# Patient Record
Sex: Female | Born: 1945 | Race: White | Hispanic: No | Smoking: Current every day smoker
Health system: Southern US, Community
[De-identification: ages and names within clinical notes are randomized; demographics above are authoritative.]

## PROBLEM LIST (undated history)

## (undated) DIAGNOSIS — F329 Major depressive disorder, single episode, unspecified: Secondary | ICD-10-CM

## (undated) DIAGNOSIS — Z860100 Personal history of colon polyps, unspecified: Secondary | ICD-10-CM

## (undated) DIAGNOSIS — K219 Gastro-esophageal reflux disease without esophagitis: Secondary | ICD-10-CM

## (undated) DIAGNOSIS — F32A Depression, unspecified: Secondary | ICD-10-CM

## (undated) DIAGNOSIS — E042 Nontoxic multinodular goiter: Secondary | ICD-10-CM

## (undated) DIAGNOSIS — C519 Malignant neoplasm of vulva, unspecified: Secondary | ICD-10-CM

## (undated) DIAGNOSIS — A630 Anogenital (venereal) warts: Secondary | ICD-10-CM

## (undated) DIAGNOSIS — M1990 Unspecified osteoarthritis, unspecified site: Secondary | ICD-10-CM

## (undated) DIAGNOSIS — I10 Essential (primary) hypertension: Secondary | ICD-10-CM

## (undated) DIAGNOSIS — R0602 Shortness of breath: Secondary | ICD-10-CM

## (undated) DIAGNOSIS — E785 Hyperlipidemia, unspecified: Secondary | ICD-10-CM

## (undated) DIAGNOSIS — J449 Chronic obstructive pulmonary disease, unspecified: Secondary | ICD-10-CM

## (undated) DIAGNOSIS — I639 Cerebral infarction, unspecified: Secondary | ICD-10-CM

## (undated) DIAGNOSIS — I6529 Occlusion and stenosis of unspecified carotid artery: Secondary | ICD-10-CM

## (undated) DIAGNOSIS — E0590 Thyrotoxicosis, unspecified without thyrotoxic crisis or storm: Secondary | ICD-10-CM

## (undated) DIAGNOSIS — K559 Vascular disorder of intestine, unspecified: Secondary | ICD-10-CM

## (undated) DIAGNOSIS — J439 Emphysema, unspecified: Secondary | ICD-10-CM

## (undated) DIAGNOSIS — I519 Heart disease, unspecified: Secondary | ICD-10-CM

## (undated) HISTORY — DX: Depression, unspecified: F32.A

## (undated) HISTORY — PX: TUBAL LIGATION: SHX77

## (undated) HISTORY — DX: Emphysema, unspecified: J43.9

## (undated) HISTORY — PX: APPENDECTOMY: SHX54

## (undated) HISTORY — PX: NOSE SURGERY: SHX723

## (undated) HISTORY — DX: Major depressive disorder, single episode, unspecified: F32.9

## (undated) HISTORY — DX: Nontoxic multinodular goiter: E04.2

## (undated) HISTORY — DX: Heart disease, unspecified: I51.9

## (undated) HISTORY — PX: CARPAL TUNNEL RELEASE: SHX101

## (undated) HISTORY — DX: Anogenital (venereal) warts: A63.0

## (undated) HISTORY — DX: Malignant neoplasm of vulva, unspecified: C51.9

## (undated) HISTORY — DX: Personal history of colon polyps, unspecified: Z86.0100

## (undated) HISTORY — DX: Unspecified osteoarthritis, unspecified site: M19.90

## (undated) HISTORY — DX: Personal history of colonic polyps: Z86.010

## (undated) HISTORY — DX: Occlusion and stenosis of unspecified carotid artery: I65.29

## (undated) HISTORY — PX: CHOLECYSTECTOMY: SHX55

## (undated) HISTORY — DX: Hyperlipidemia, unspecified: E78.5

## (undated) HISTORY — DX: Vascular disorder of intestine, unspecified: K55.9

---
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
## 1973-08-05 DIAGNOSIS — C519 Malignant neoplasm of vulva, unspecified: Secondary | ICD-10-CM

## 1973-08-05 HISTORY — DX: Malignant neoplasm of vulva, unspecified: C51.9

## 1998-03-02 ENCOUNTER — Ambulatory Visit (HOSPITAL_COMMUNITY): Admission: RE | Admit: 1998-03-02 | Discharge: 1998-03-02 | Payer: Self-pay | Admitting: Family Medicine

## 2001-11-03 ENCOUNTER — Emergency Department (HOSPITAL_COMMUNITY): Admission: EM | Admit: 2001-11-03 | Discharge: 2001-11-03 | Payer: Self-pay | Admitting: Emergency Medicine

## 2008-08-05 DIAGNOSIS — I639 Cerebral infarction, unspecified: Secondary | ICD-10-CM

## 2008-08-05 HISTORY — DX: Cerebral infarction, unspecified: I63.9

## 2011-10-09 ENCOUNTER — Encounter (HOSPITAL_COMMUNITY): Payer: Self-pay

## 2011-10-09 ENCOUNTER — Emergency Department (HOSPITAL_COMMUNITY)
Admission: EM | Admit: 2011-10-09 | Discharge: 2011-10-09 | Disposition: A | Discharge status: Home Health | Payer: Medicare Other | Attending: Emergency Medicine | Admitting: Emergency Medicine

## 2011-10-09 ENCOUNTER — Emergency Department (HOSPITAL_COMMUNITY): Payer: Medicare Other

## 2011-10-09 DIAGNOSIS — J4489 Other specified chronic obstructive pulmonary disease: Secondary | ICD-10-CM | POA: Insufficient documentation

## 2011-10-09 DIAGNOSIS — Z8679 Personal history of other diseases of the circulatory system: Secondary | ICD-10-CM | POA: Insufficient documentation

## 2011-10-09 DIAGNOSIS — I10 Essential (primary) hypertension: Secondary | ICD-10-CM | POA: Insufficient documentation

## 2011-10-09 DIAGNOSIS — R109 Unspecified abdominal pain: Secondary | ICD-10-CM | POA: Insufficient documentation

## 2011-10-09 DIAGNOSIS — R350 Frequency of micturition: Secondary | ICD-10-CM | POA: Insufficient documentation

## 2011-10-09 DIAGNOSIS — J449 Chronic obstructive pulmonary disease, unspecified: Secondary | ICD-10-CM | POA: Insufficient documentation

## 2011-10-09 HISTORY — DX: Essential (primary) hypertension: I10

## 2011-10-09 HISTORY — DX: Cerebral infarction, unspecified: I63.9

## 2011-10-09 HISTORY — DX: Chronic obstructive pulmonary disease, unspecified: J44.9

## 2011-10-09 LAB — URINALYSIS, ROUTINE W REFLEX MICROSCOPIC
Protein, ur: NEGATIVE mg/dL
Specific Gravity, Urine: 1.008 (ref 1.005–1.030)
Urobilinogen, UA: 0.2 mg/dL (ref 0.0–1.0)

## 2011-10-09 LAB — URINE MICROSCOPIC-ADD ON

## 2011-10-09 MED ORDER — OXYCODONE-ACETAMINOPHEN 5-325 MG PO TABS
1.0000 | ORAL_TABLET | Freq: Once | ORAL | Status: AC
  Administered 2011-10-09: 1 via ORAL
  Filled 2011-10-09: qty 1

## 2011-10-09 MED ORDER — OXYCODONE-ACETAMINOPHEN 5-325 MG PO TABS: 1.0000 | ORAL_TABLET | Freq: Once | ORAL | Status: AC

## 2011-10-09 MED ORDER — ONDANSETRON 4 MG PO TBDP
8.0000 mg | ORAL_TABLET | Freq: Once | ORAL | Status: AC
  Administered 2011-10-09: 8 mg via ORAL
  Filled 2011-10-09: qty 2

## 2011-10-09 NOTE — ED Provider Notes (Signed)
Medical screening examination/treatment/procedure(s) were conducted as a shared visit with non-physician practitioner(s) and myself.  I personally evaluated the patient during the encounter   Joya Gaskins, MD 10/09/11 [phone]

## 2011-10-09 NOTE — ED Notes (Signed)
Returned from ct 

## 2011-10-09 NOTE — Discharge Instructions (Signed)

## 2011-10-09 NOTE — ED Provider Notes (Signed)
History     CSN: [id number]  Arrival date & time 10/09/11  1328   First MD Initiated Contact with Patient 10/09/11 1417      Chief Complaint  Patient presents with  . Flank Pain    Patient is a 66 y.o. female presenting with flank pain. The history is provided by the patient.  Flank Pain This is a new problem. The current episode started more than 2 days ago. The problem occurs constantly. The problem has been gradually worsening. Pertinent negatives include no chest pain and no abdominal pain. The symptoms are aggravated by nothing. The symptoms are relieved by nothing.  she has not tried anything for the symptoms she also reports dark colored urine and urinary frequency She denies known h/o kidney stones No falls reported No weakness/numbness in her legs   Past Medical History  Diagnosis Date  . COPD (chronic obstructive pulmonary disease)   . Stroke   . Hypertension   . Cancer     Past Surgical History  Procedure Date  . Cholecystectomy   . Abdominal hysterectomy   . Tubal ligation   . Appendectomy   . Carpal tunnel release   . Nose surgery     No family history on file.  History  Substance Use Topics  . Smoking status: Current Everyday Smoker -- 1.0 packs/day  . Smokeless tobacco: Not on file  . Alcohol Use: No    OB History    Grav Para Term Preterm Abortions TAB SAB Ect Mult Living                  Review of Systems  Cardiovascular: Negative for chest pain.  Gastrointestinal: Negative for abdominal pain.  Genitourinary: Positive for flank pain.  All other systems reviewed and are negative.    Allergies  Review of patient's allergies indicates no known allergies.  Home Medications   Current Outpatient Rx  Name Route Sig Dispense Refill  . ALBUTEROL SULFATE (2.5 MG/3ML) 0.083% IN NEBU Nebulization Take 2.5 mg by nebulization every 6 (six) hours as needed. For shortness of breath    . AMLODIPINE BESYLATE 10 MG PO TABS Oral Take 10 mg by mouth  daily.    Marland Kitchen CLOPIDOGREL BISULFATE 75 MG PO TABS Oral Take 75 mg by mouth daily.    Marland Kitchen FLUOXETINE HCL 40 MG PO CAPS Oral Take 40 mg by mouth daily.    . IBUPROFEN 200 MG PO TABS Oral Take 400 mg by mouth every 6 (six) hours as needed. For pain    . LISINOPRIL 20 MG PO TABS Oral Take 20 mg by mouth daily.    . MOMETASONE FURO-FORMOTEROL FUM 100-5 MCG/ACT IN AERO Inhalation Inhale 2 puffs into the lungs 2 (two) times daily.    Marland Kitchen PANTOPRAZOLE SODIUM 40 MG PO TBEC Oral Take 40 mg by mouth daily.    Marland Kitchen PRAMIPEXOLE DIHYDROCHLORIDE 0.25 MG PO TABS Oral Take 0.25 mg by mouth at bedtime.    Marland Kitchen TIOTROPIUM BROMIDE MONOHYDRATE 18 MCG IN CAPS Inhalation Place 18 mcg into inhaler and inhale daily.    . TRAMADOL HCL 50 MG PO TABS Oral Take 50 mg by mouth every 6 (six) hours as needed. For pain    . TRAZODONE HCL 50 MG PO TABS Oral Take 50 mg by mouth at bedtime.      BP 137/64  Pulse 74  Temp(Src) 97.9 F (36.6 C) (Oral)  Resp 18  SpO2 100%  Physical Exam CONSTITUTIONAL: Well developed/well nourished HEAD  AND FACE: Normocephalic/atraumatic EYES: EOMI/PERRL ENMT: Mucous membranes moist NECK: supple no meningeal signs SPINE:entire spine nontender, No bruising/crepitance/stepoffs noted to spine CV: S1/S2 noted, no murmurs/rubs/gallops noted LUNGS: Lungs are clear to auscultation bilaterally, no apparent distress ABDOMEN: soft, nontender, no rebound or guarding WU:JWJX cva tenderness NEURO: Pt is awake/alert, moves all extremitiesx4 EXTREMITIES: pulses normal, full ROM SKIN: warm, color normal PSYCH: no abnormalities of mood noted  ED Course  Procedures   Labs Reviewed  URINALYSIS, ROUTINE W REFLEX MICROSCOPIC - Abnormal; Notable for the following:    Leukocytes, UA TRACE (*)    All other components within normal limits  URINE MICROSCOPIC-ADD ON - Abnormal; Notable for the following:    Squamous Epithelial / LPF FEW (*)    All other components within normal limits   3:25 PM Pt with flank  pain, urinary frequency, will get CT imaging  MDM  Nursing notes reviewed and considered in documentation All labs/vitals reviewed and considered         Joya Gaskins, MD 10/09/11 [phone]

## 2011-10-09 NOTE — ED Notes (Signed)
Pt presents with 3 day h/o R flank pain.  She reports pain has been constant and does not radiate.  Pt reports urinary frequency, denies any dysuria.  Pt reports dark-colored urine.

## 2011-10-09 NOTE — ED Provider Notes (Signed)
4:40 PM Patient reports pain is controlled after receiving. CT scan shows no acute finding that may be the cause of the pain today however patient does have is now finding of 2.5 cm renal cyst. Advised patient to have this further evaluated per care physician when she returns home. Patient does not have any abnormal normal urine results indicate possible kidney disease followup will be needed when she returns home patient voices understanding and is ready for discharge. Will prescribe a few Percocet for breakthrough pain.   CT ABDOMEN AND PELVIS WITHOUT CONTRAST   Technique: Multidetector CT imaging of the abdomen and pelvis was  performed following the standard protocol without intravenous  contrast.  Comparison: No priors.   Findings:  Lung Bases: Extensive centrilobular emphysema noted in the  visualized lung bases. Areas of scarring in the right middle lobe.  No consolidative airspace disease. No pleural effusions.  Calcification of the mitral annulus.  Abdomen/Pelvis: Status post cholecystectomy. The unenhanced  appearance of the liver, pancreas, spleen, bilateral adrenal glands  and right kidney is unremarkable. In the anterior aspect of the  interpolar region of the left kidney there is a 2.5 cm low  attenuation lesion which is not fully characterized on this  noncontrast CT examination. There are no abnormal calcifications  within the collecting system of either kidney, or along the course  of either ureter. No calcifications are noted within the lumen of  the urinary bladder. Extensive atherosclerosis. No  hydroureteronephrosis. No ascites or pneumoperitoneum and no  pathologic distension of bowel. No definite pathologic  lymphadenopathy appreciated within the abdomen or pelvis. The  patient is status post total abdominal hysterectomy and bilateral  salpingo-oophorectomy. Urinary bladder is unremarkable in  appearance. Laxity of the levator ani musculature and low-lying    pelvic organs suggests underlying pelvic organ prolapse.  Musculoskeletal: There are no aggressive appearing lytic or blastic  lesions noted in the visualized portions of the skeleton.  IMPRESSION:  1. The study is negative for abnormal urinary tract  calcifications.  2. No acute findings in the abdomen or pelvis to account for the  patient's symptoms.  3. 2.5 cm low attenuation lesion in the interpolar region of left  kidney is not fully characterized on this examination.  Statistically, this is likely to represent a cyst, but could be  further characterized with renal ultrasound if clinically  indicated.  4. Status post cholecystectomy, hysterectomy and bilateral  salpingo-oophorectomy.  5. Extensive atherosclerosis.  6. Laxity of the levator ani musculature and low-lying pelvic  organs. These findings could suggest underlying pelvic organ  prolapse.  7. Emphysema.    Thomasene Lot, PA-C 10/09/11 1642

## 2011-10-09 NOTE — ED Notes (Signed)
Patient transported to CT 

## 2012-01-10 ENCOUNTER — Encounter: Payer: Self-pay | Admitting: Internal Medicine

## 2012-01-10 DIAGNOSIS — Z0000 Encounter for general adult medical examination without abnormal findings: Secondary | ICD-10-CM | POA: Insufficient documentation

## 2012-01-17 ENCOUNTER — Encounter: Payer: Self-pay | Admitting: Internal Medicine

## 2012-01-17 ENCOUNTER — Ambulatory Visit (INDEPENDENT_AMBULATORY_CARE_PROVIDER_SITE_OTHER): Payer: 59 | Admitting: Internal Medicine

## 2012-01-17 ENCOUNTER — Other Ambulatory Visit: Payer: Self-pay | Admitting: Internal Medicine

## 2012-01-17 ENCOUNTER — Other Ambulatory Visit (INDEPENDENT_AMBULATORY_CARE_PROVIDER_SITE_OTHER): Payer: 59

## 2012-01-17 VITALS — BP 122/62 | HR 67 | Temp 97.0°F | Ht 62.0 in | Wt 125.0 lb

## 2012-01-17 DIAGNOSIS — F32A Depression, unspecified: Secondary | ICD-10-CM | POA: Insufficient documentation

## 2012-01-17 DIAGNOSIS — C519 Malignant neoplasm of vulva, unspecified: Secondary | ICD-10-CM | POA: Insufficient documentation

## 2012-01-17 DIAGNOSIS — E0590 Thyrotoxicosis, unspecified without thyrotoxic crisis or storm: Secondary | ICD-10-CM

## 2012-01-17 DIAGNOSIS — M1990 Unspecified osteoarthritis, unspecified site: Secondary | ICD-10-CM | POA: Insufficient documentation

## 2012-01-17 DIAGNOSIS — Z720 Tobacco use: Secondary | ICD-10-CM

## 2012-01-17 DIAGNOSIS — I10 Essential (primary) hypertension: Secondary | ICD-10-CM | POA: Insufficient documentation

## 2012-01-17 DIAGNOSIS — Z0000 Encounter for general adult medical examination without abnormal findings: Secondary | ICD-10-CM

## 2012-01-17 DIAGNOSIS — F17200 Nicotine dependence, unspecified, uncomplicated: Secondary | ICD-10-CM

## 2012-01-17 DIAGNOSIS — J449 Chronic obstructive pulmonary disease, unspecified: Secondary | ICD-10-CM | POA: Insufficient documentation

## 2012-01-17 DIAGNOSIS — I639 Cerebral infarction, unspecified: Secondary | ICD-10-CM | POA: Insufficient documentation

## 2012-01-17 DIAGNOSIS — K635 Polyp of colon: Secondary | ICD-10-CM

## 2012-01-17 DIAGNOSIS — E079 Disorder of thyroid, unspecified: Secondary | ICD-10-CM | POA: Insufficient documentation

## 2012-01-17 DIAGNOSIS — M25519 Pain in unspecified shoulder: Secondary | ICD-10-CM

## 2012-01-17 DIAGNOSIS — K559 Vascular disorder of intestine, unspecified: Secondary | ICD-10-CM | POA: Insufficient documentation

## 2012-01-17 DIAGNOSIS — J439 Emphysema, unspecified: Secondary | ICD-10-CM | POA: Insufficient documentation

## 2012-01-17 DIAGNOSIS — G459 Transient cerebral ischemic attack, unspecified: Secondary | ICD-10-CM | POA: Insufficient documentation

## 2012-01-17 DIAGNOSIS — T7840XA Allergy, unspecified, initial encounter: Secondary | ICD-10-CM | POA: Insufficient documentation

## 2012-01-17 DIAGNOSIS — D126 Benign neoplasm of colon, unspecified: Secondary | ICD-10-CM

## 2012-01-17 DIAGNOSIS — I6529 Occlusion and stenosis of unspecified carotid artery: Secondary | ICD-10-CM

## 2012-01-17 DIAGNOSIS — E785 Hyperlipidemia, unspecified: Secondary | ICD-10-CM | POA: Insufficient documentation

## 2012-01-17 DIAGNOSIS — F329 Major depressive disorder, single episode, unspecified: Secondary | ICD-10-CM | POA: Insufficient documentation

## 2012-01-17 DIAGNOSIS — E042 Nontoxic multinodular goiter: Secondary | ICD-10-CM | POA: Insufficient documentation

## 2012-01-17 DIAGNOSIS — M25511 Pain in right shoulder: Secondary | ICD-10-CM | POA: Insufficient documentation

## 2012-01-17 LAB — URINALYSIS, ROUTINE W REFLEX MICROSCOPIC
Leukocytes, UA: NEGATIVE
Nitrite: NEGATIVE
Specific Gravity, Urine: <=1.005 (ref 1.000–1.030)
Urobilinogen, UA: 0.2 (ref 0.0–1.0)

## 2012-01-17 LAB — CBC WITH DIFFERENTIAL/PLATELET
Basophils Absolute: 0.2 10*3/uL — ABNORMAL HIGH (ref 0.0–0.1)
Eosinophils Absolute: 0.3 10*3/uL (ref 0.0–0.7)
Hemoglobin: 13.2 g/dL (ref 12.0–15.0)
Lymphocytes Relative: 21.3 % (ref 12.0–46.0)
MCHC: 33.3 g/dL (ref 30.0–36.0)
Neutro Abs: 8.7 10*3/uL — ABNORMAL HIGH (ref 1.4–7.7)
RDW: 14.6 % (ref 11.5–14.6)

## 2012-01-17 LAB — HEPATIC FUNCTION PANEL
ALT: 7 U/L (ref 0–35)
AST: 14 U/L (ref 0–37)
Alkaline Phosphatase: 114 U/L (ref 39–117)
Bilirubin, Direct: 0.1 mg/dL (ref 0.0–0.3)
Total Protein: 6.9 g/dL (ref 6.0–8.3)

## 2012-01-17 LAB — BASIC METABOLIC PANEL
CO2: 26 mEq/L (ref 19–32)
Chloride: 105 mEq/L (ref 96–112)
Potassium: 4.6 mEq/L (ref 3.5–5.1)
Sodium: 137 mEq/L (ref 135–145)

## 2012-01-17 LAB — LIPID PANEL: Total CHOL/HDL Ratio: 5

## 2012-01-17 MED ORDER — PANTOPRAZOLE SODIUM 40 MG PO TBEC: 40.0000 mg | DELAYED_RELEASE_TABLET | Freq: Every day | ORAL | Status: DC

## 2012-01-17 MED ORDER — CLOPIDOGREL BISULFATE 75 MG PO TABS: 75.0000 mg | ORAL_TABLET | Freq: Every day | ORAL | Status: DC

## 2012-01-17 MED ORDER — AMLODIPINE BESYLATE 10 MG PO TABS: 10.0000 mg | ORAL_TABLET | Freq: Every day | ORAL | Status: DC

## 2012-01-17 MED ORDER — TRAMADOL HCL 50 MG PO TABS: 50.0000 mg | ORAL_TABLET | Freq: Four times a day (QID) | ORAL | Status: DC | PRN

## 2012-01-17 MED ORDER — ALBUTEROL SULFATE (2.5 MG/3ML) 0.083% IN NEBU: 2.5000 mg | INHALATION_SOLUTION | Freq: Four times a day (QID) | RESPIRATORY_TRACT | Status: DC | PRN

## 2012-01-17 MED ORDER — MOMETASONE FURO-FORMOTEROL FUM 200-5 MCG/ACT IN AERO: 2.0000 | INHALATION_SPRAY | Freq: Two times a day (BID) | RESPIRATORY_TRACT | Status: DC

## 2012-01-17 MED ORDER — ALBUTEROL SULFATE HFA 108 (90 BASE) MCG/ACT IN AERS: 2.0000 | INHALATION_SPRAY | Freq: Four times a day (QID) | RESPIRATORY_TRACT | Status: DC | PRN

## 2012-01-17 MED ORDER — LISINOPRIL 20 MG PO TABS: 20.0000 mg | ORAL_TABLET | Freq: Every day | ORAL | Status: DC

## 2012-01-17 MED ORDER — VARENICLINE TARTRATE 1 MG PO TABS: 1.0000 mg | ORAL_TABLET | Freq: Two times a day (BID) | ORAL | Status: DC

## 2012-01-17 MED ORDER — VARENICLINE TARTRATE 0.5 MG X 11 & 1 MG X 42 PO MISC: ORAL | Status: DC

## 2012-01-17 MED ORDER — TIOTROPIUM BROMIDE MONOHYDRATE 18 MCG IN CAPS: 18.0000 ug | ORAL_CAPSULE | Freq: Every day | RESPIRATORY_TRACT | Status: DC

## 2012-01-17 MED ORDER — PRAMIPEXOLE DIHYDROCHLORIDE 0.25 MG PO TABS: 0.2500 mg | ORAL_TABLET | Freq: Every day | ORAL | Status: DC

## 2012-01-17 MED ORDER — TRAZODONE HCL 50 MG PO TABS: 50.0000 mg | ORAL_TABLET | Freq: Every day | ORAL | Status: DC

## 2012-01-17 MED ORDER — FLUOXETINE HCL 40 MG PO CAPS: 40.0000 mg | ORAL_CAPSULE | Freq: Every day | ORAL | Status: DC

## 2012-01-17 NOTE — Patient Instructions (Addendum)
Your medications were refilled today, including the chantix Please go to LAB in the Basement for the blood and/or urine tests to be done today You will be contacted by phone if any changes need to be made immediately.  Otherwise, you will receive a letter about your results with an explanation. You will be contacted regarding the referral for: carotid ultrasound, and thyroid ultrasound You will be contacted regarding the referral for: orthopedic for the shoulder, and GI for the bowels Please stop smoking Please return in 6 months, or sooner if needed

## 2012-01-17 NOTE — Progress Notes (Signed)
Subjective:    Patient ID: Cheryl Mcguire, female    DOB: [birth year], 66 y.o.   MRN: [id number]  HPI  Here for wellness and to establish;  Overall doing ok;  Pt denies CP, worsening SOB, DOE, wheezing, orthopnea, PND, worsening LE edema, palpitations, dizziness or syncope.  Pt denies neurological change such as new Headache, facial or extremity weakness.  Pt denies polydipsia, polyuria, or low sugar symptoms. Pt states overall good compliance with treatment and medications, good tolerability, and trying to follow lower cholesterol diet.  Pt denies worsening depressive symptoms, suicidal ideation or panic. No fever, wt loss, night sweats, loss of appetite, or other constitutional symptoms.  Pt states good ability with ADL's, low fall risk, home safety reviewed and adequate, no significant changes in hearing or vision, and occasionally active with exercise.  Due for thyroid u/s and carotid u/s f/u.  Also c/o right shoulder pain, worsening again as happened before a cortisone shot helped for a long time, now worse after moving boxes and such with her move to [place], lives with mother. Overall good compliance with treatment, and good medicine tolerability, but has been out of plavix for 2 mo.  Needs dulera sample.  Has not had carotid u/s f/u, last done July 2012, due for thyroid u/s.  Also due for colonscopy f/u per pt with hx of colon polyps. Wants help to quit smoking. Past Medical History  Diagnosis Date  . Genital warts   . Heart disease   . Colon polyps   . UTI (lower urinary tract infection)   . Allergy   . Arthritis   . Vulva cancer 1975  . COPD (chronic obstructive pulmonary disease)   . Depression   . Emphysema of lung   . Hypertension   . Hyperlipidemia   . Multinodular goiter (nontoxic)   . 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 Ischemia, bowel   . Carotid artery stenosis     bilateral   Past Surgical History  Procedure Date  . Cholecystectomy   . Tubal ligation   .  Appendectomy   . Carpal tunnel release   . Nose surgery   . Abdominal hysterectomy 1964    reports that she has been smoking.  She has never used smokeless tobacco. She reports that she does not drink alcohol or use illicit drugs. family history includes Alcohol abuse in her others; Arthritis in her others; Diabetes in her others; Heart disease in her others; Hyperlipidemia in her other; Hypertension in her others; Kidney disease in her other; Mental illness in her others; and Stroke in her other. No Known Allergies Current Outpatient Prescriptions on File Prior to Visit  Medication Sig Dispense Refill  . albuterol (PROVENTIL) (2.5 MG/3ML) 0.083% nebulizer solution Take 3 mLs (2.5 mg total) by nebulization every 6 (six) hours as needed. For shortness of breath  75 mL  11  . amLODipine (NORVASC) 10 MG tablet Take 1 tablet (10 mg total) by mouth daily.  90 tablet  3  . FLUoxetine (PROZAC) 40 MG capsule Take 1 capsule (40 mg total) by mouth daily.  90 capsule  3  . ibuprofen (ADVIL,MOTRIN) 200 MG tablet Take 400 mg by mouth every 6 (six) hours as needed. For pain      . lisinopril (PRINIVIL,ZESTRIL) 20 MG tablet Take 1 tablet (20 mg total) by mouth daily.  90 tablet  3  . pantoprazole (PROTONIX) 40 MG tablet Take 1 tablet (40 mg total) by  mouth daily.  90 tablet  3  . pramipexole (MIRAPEX) 0.25 MG tablet Take 1 tablet (0.25 mg total) by mouth at bedtime.  90 tablet  3  . tiotropium (SPIRIVA) 18 MCG inhalation capsule Place 1 capsule (18 mcg total) into inhaler and inhale daily.  90 capsule  3  . traZODone (DESYREL) 50 MG tablet Take 1 tablet (50 mg total) by mouth at bedtime.  90 tablet  1   Review of Systems Review of Systems  Constitutional: Negative for diaphoresis, activity change, appetite change and unexpected weight change.  HENT: Negative for hearing loss, ear pain, facial swelling, mouth sores and neck stiffness.   Eyes: Negative for pain, redness and visual disturbance.  Respiratory:  Negative for shortness of breath and wheezing.   Cardiovascular: Negative for chest pain and palpitations.  Gastrointestinal: Negative for diarrhea, blood in stool, abdominal distention and rectal pain.  Genitourinary: Negative for hematuria, flank pain and decreased urine volume.  Musculoskeletal: Negative for myalgias and joint swelling.  Skin: Negative for color change and wound.  Neurological: Negative for syncope and numbness.  Hematological: Negative for adenopathy.  Psychiatric/Behavioral: Negative for hallucinations, self-injury, decreased concentration and agitation.      Objective:   Physical Exam BP 122/62  Pulse 67  Temp 97 F (36.1 C) (Oral)  Ht 5\' 2"  (1.575 m)  Wt 125 lb (56.7 kg)  BMI 22.86 kg/m2  SpO2 95% Physical Exam  VS noted Constitutional: Pt is oriented to person, place, and time. Appears well-developed and well-nourished.  HENT:  Head: Normocephalic and atraumatic.  Right Ear: External ear normal.  Left Ear: External ear normal.  Nose: Nose normal.  Mouth/Throat: Oropharynx is clear and moist.  Eyes: Conjunctivae and EOM are normal. Pupils are equal, round, and reactive to light.  Neck: Normal range of motion. Neck supple. No JVD present. No tracheal deviation present.  Cardiovascular: Normal rate, regular rhythm, normal heart sounds and intact distal pulses.   Pulmonary/Chest: Effort normal and breath sounds normal.  Abdominal: Soft. Bowel sounds are normal. There is no tenderness.  Musculoskeletal: Normal range of motion. Exhibits no edema. right shoulder with tender subacromail, decreased ROM Lymphadenopathy:  Has no cervical adenopathy.  Neurological: Pt is alert and oriented to person, place, and time. Pt has normal reflexes. No cranial nerve deficit.  Skin: Skin is warm and dry. No rash noted.  Psychiatric:  Has  normal mood and affect. Behavior is normal.     Assessment & Plan:

## 2012-01-17 NOTE — Assessment & Plan Note (Addendum)
due for colonoscopy per pt, will try to get prior records, but also refer to GI , ok to hold plavix for procedure

## 2012-01-17 NOTE — Assessment & Plan Note (Signed)

## 2012-01-18 ENCOUNTER — Encounter: Payer: Self-pay | Admitting: Internal Medicine

## 2012-01-18 NOTE — Assessment & Plan Note (Signed)
Due for u/s followup - to be referred

## 2012-01-18 NOTE — Assessment & Plan Note (Signed)
For ortho eval - to be referred

## 2012-01-18 NOTE — Assessment & Plan Note (Signed)
Ok for chantix  

## 2012-01-18 NOTE — Assessment & Plan Note (Signed)
Also due for carotid doppler f/u

## 2012-01-21 ENCOUNTER — Ambulatory Visit
Admission: RE | Admit: 2012-01-21 | Discharge: 2012-01-21 | Disposition: A | Discharge status: Home / Self Care | Payer: 59 | Source: Ambulatory Visit | Attending: Internal Medicine | Admitting: Internal Medicine

## 2012-01-21 DIAGNOSIS — E042 Nontoxic multinodular goiter: Secondary | ICD-10-CM

## 2012-01-22 ENCOUNTER — Telehealth: Payer: Self-pay | Admitting: *Deleted

## 2012-01-22 ENCOUNTER — Other Ambulatory Visit: Payer: Self-pay | Admitting: Cardiology

## 2012-01-22 ENCOUNTER — Encounter: Payer: Self-pay | Admitting: Internal Medicine

## 2012-01-22 DIAGNOSIS — I6529 Occlusion and stenosis of unspecified carotid artery: Secondary | ICD-10-CM

## 2012-01-22 NOTE — Telephone Encounter (Signed)
Pt calling for results of thyroid U/S done yesterday.

## 2012-01-22 NOTE — Telephone Encounter (Signed)
Already addressed with letter - no suspicious nodule at this time on the thyroid u/s, but should f/u at 1 yr to assess for any change (or sooner if pt notices any change in size)

## 2012-01-23 NOTE — Telephone Encounter (Signed)
Patient informed of results.  

## 2012-01-27 ENCOUNTER — Encounter (INDEPENDENT_AMBULATORY_CARE_PROVIDER_SITE_OTHER): Payer: PRIVATE HEALTH INSURANCE

## 2012-01-27 DIAGNOSIS — I6529 Occlusion and stenosis of unspecified carotid artery: Secondary | ICD-10-CM

## 2012-01-29 ENCOUNTER — Encounter: Payer: Self-pay | Admitting: Internal Medicine

## 2012-02-11 ENCOUNTER — Encounter: Payer: Self-pay | Admitting: Internal Medicine

## 2012-02-11 ENCOUNTER — Emergency Department (HOSPITAL_COMMUNITY): Payer: PRIVATE HEALTH INSURANCE

## 2012-02-11 ENCOUNTER — Emergency Department (HOSPITAL_COMMUNITY)
Admission: EM | Admit: 2012-02-11 | Discharge: 2012-02-11 | Disposition: A | Discharge status: Home / Self Care | Payer: PRIVATE HEALTH INSURANCE | Attending: Emergency Medicine | Admitting: Emergency Medicine

## 2012-02-11 ENCOUNTER — Encounter (HOSPITAL_COMMUNITY): Payer: Self-pay | Admitting: *Deleted

## 2012-02-11 DIAGNOSIS — Z7982 Long term (current) use of aspirin: Secondary | ICD-10-CM | POA: Insufficient documentation

## 2012-02-11 DIAGNOSIS — Z8544 Personal history of malignant neoplasm of other female genital organs: Secondary | ICD-10-CM | POA: Insufficient documentation

## 2012-02-11 DIAGNOSIS — R109 Unspecified abdominal pain: Secondary | ICD-10-CM | POA: Insufficient documentation

## 2012-02-11 DIAGNOSIS — M129 Arthropathy, unspecified: Secondary | ICD-10-CM | POA: Insufficient documentation

## 2012-02-11 DIAGNOSIS — J4489 Other specified chronic obstructive pulmonary disease: Secondary | ICD-10-CM | POA: Insufficient documentation

## 2012-02-11 DIAGNOSIS — R062 Wheezing: Secondary | ICD-10-CM | POA: Insufficient documentation

## 2012-02-11 DIAGNOSIS — R059 Cough, unspecified: Secondary | ICD-10-CM | POA: Insufficient documentation

## 2012-02-11 DIAGNOSIS — E785 Hyperlipidemia, unspecified: Secondary | ICD-10-CM | POA: Insufficient documentation

## 2012-02-11 DIAGNOSIS — Z79899 Other long term (current) drug therapy: Secondary | ICD-10-CM | POA: Insufficient documentation

## 2012-02-11 DIAGNOSIS — F3289 Other specified depressive episodes: Secondary | ICD-10-CM | POA: Insufficient documentation

## 2012-02-11 DIAGNOSIS — I10 Essential (primary) hypertension: Secondary | ICD-10-CM | POA: Insufficient documentation

## 2012-02-11 DIAGNOSIS — Z8673 Personal history of transient ischemic attack (TIA), and cerebral infarction without residual deficits: Secondary | ICD-10-CM | POA: Insufficient documentation

## 2012-02-11 DIAGNOSIS — R110 Nausea: Secondary | ICD-10-CM | POA: Insufficient documentation

## 2012-02-11 DIAGNOSIS — R05 Cough: Secondary | ICD-10-CM | POA: Insufficient documentation

## 2012-02-11 DIAGNOSIS — R0602 Shortness of breath: Secondary | ICD-10-CM | POA: Insufficient documentation

## 2012-02-11 DIAGNOSIS — J449 Chronic obstructive pulmonary disease, unspecified: Secondary | ICD-10-CM

## 2012-02-11 DIAGNOSIS — F329 Major depressive disorder, single episode, unspecified: Secondary | ICD-10-CM | POA: Insufficient documentation

## 2012-02-11 LAB — COMPREHENSIVE METABOLIC PANEL
ALT: 7 U/L (ref 0–35)
AST: 10 U/L (ref 0–37)
Calcium: 9.3 mg/dL (ref 8.4–10.5)
GFR calc Af Amer: 49 mL/min — ABNORMAL LOW (ref >90)
Glucose, Bld: 96 mg/dL (ref 70–99)
Sodium: 133 mEq/L — ABNORMAL LOW (ref 135–145)
Total Protein: 6.5 g/dL (ref 6.0–8.3)

## 2012-02-11 LAB — CBC WITH DIFFERENTIAL/PLATELET
Basophils Absolute: 0 10*3/uL (ref 0.0–0.1)
Basophils Relative: 0 % (ref 0–1)
Eosinophils Absolute: 0.4 10*3/uL (ref 0.0–0.7)
Eosinophils Relative: 2 % (ref 0–5)
MCH: 28.3 pg (ref 26.0–34.0)
MCHC: 33 g/dL (ref 30.0–36.0)
MCV: 85.7 fL (ref 78.0–100.0)
Platelets: 461 10*3/uL — ABNORMAL HIGH (ref 150–400)
RDW: 14.3 % (ref 11.5–15.5)

## 2012-02-11 LAB — URINE MICROSCOPIC-ADD ON

## 2012-02-11 LAB — URINALYSIS, ROUTINE W REFLEX MICROSCOPIC
Bilirubin Urine: NEGATIVE
Nitrite: NEGATIVE
Specific Gravity, Urine: <1.005 — ABNORMAL LOW (ref 1.005–1.030)
pH: 7 (ref 5.0–8.0)

## 2012-02-11 MED ORDER — PREDNISONE 10 MG PO TABS: ORAL_TABLET | ORAL | Status: DC

## 2012-02-11 MED ORDER — ALBUTEROL SULFATE (5 MG/ML) 0.5% IN NEBU
2.5000 mg | INHALATION_SOLUTION | Freq: Once | RESPIRATORY_TRACT | Status: AC
  Administered 2012-02-11: 2.5 mg via RESPIRATORY_TRACT
  Filled 2012-02-11: qty 0.5

## 2012-02-11 MED ORDER — ALBUTEROL SULFATE HFA 108 (90 BASE) MCG/ACT IN AERS: 2.0000 | INHALATION_SPRAY | RESPIRATORY_TRACT | Status: DC | PRN

## 2012-02-11 MED ORDER — ALBUTEROL SULFATE HFA 108 (90 BASE) MCG/ACT IN AERS: 1.0000 | INHALATION_SPRAY | Freq: Four times a day (QID) | RESPIRATORY_TRACT | Status: DC | PRN

## 2012-02-11 MED ORDER — AMOXICILLIN 250 MG PO CAPS
500.0000 mg | ORAL_CAPSULE | Freq: Once | ORAL | Status: AC
  Administered 2012-02-11: 500 mg via ORAL
  Filled 2012-02-11: qty 2

## 2012-02-11 MED ORDER — PREDNISONE 20 MG PO TABS
20.0000 mg | ORAL_TABLET | Freq: Once | ORAL | Status: AC
  Administered 2012-02-11: 20 mg via ORAL
  Filled 2012-02-11: qty 1

## 2012-02-11 MED ORDER — AMOXICILLIN 500 MG PO CAPS: 500.0000 mg | ORAL_CAPSULE | Freq: Three times a day (TID) | ORAL | Status: AC

## 2012-02-11 MED ORDER — IPRATROPIUM BROMIDE 0.02 % IN SOLN
0.5000 mg | Freq: Once | RESPIRATORY_TRACT | Status: AC
  Administered 2012-02-11: 0.5 mg via RESPIRATORY_TRACT
  Filled 2012-02-11: qty 2.5

## 2012-02-11 MED ORDER — SODIUM CHLORIDE 0.9 % IV BOLUS (SEPSIS)
1000.0000 mL | Freq: Once | INTRAVENOUS | Status: AC
  Administered 2012-02-11: 1000 mL via INTRAVENOUS

## 2012-02-11 NOTE — ED Notes (Signed)
Pt c/o abd pain that started Friday or Saturday, sob that started today, nausea that started Friday as well, pt has hx of copd, does not have any inhaler or neb machine at home due to losing them in a recent move.

## 2012-02-11 NOTE — ED Provider Notes (Cosign Needed)
History     CSN: [id number]  Arrival date & time 02/11/12  1656   First MD Initiated Contact with Patient 02/11/12 1804      Chief Complaint  Patient presents with  . Abdominal Pain  . Shortness of Breath  . Nausea    (Consider location/radiation/quality/duration/timing/severity/associated sxs/prior treatment) Patient is a 66 y.o. female presenting with cough. The history is provided by the patient (the pt complains of some sob and cough). No language interpreter was used.  Cough This is a new problem. The problem occurs hourly. The problem has not changed since onset.The cough is non-productive. There has been no fever. Pertinent negatives include no chest pain, no chills and no headaches. She has tried nothing for the symptoms. The treatment provided no relief.    Past Medical History  Diagnosis Date  . Genital warts   . Heart disease   . Colon polyps   . UTI (lower urinary tract infection)   . Allergy   . Arthritis   . Vulva cancer 1975  . COPD (chronic obstructive pulmonary disease)   . Depression   . Emphysema of lung   . Hypertension   . Hyperlipidemia   . Multinodular goiter (nontoxic)   . 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 Ischemia, bowel   . Carotid artery stenosis     bilateral    Past Surgical History  Procedure Date  . Cholecystectomy   . Tubal ligation   . Appendectomy   . Carpal tunnel release   . Nose surgery   . Abdominal hysterectomy 1964    Family History  Problem Relation Age of Onset  . Alcohol abuse Other   . Arthritis Other   . Heart disease Other   . Stroke Other   . Kidney disease Other   . Diabetes Other   . Hypertension Other   . Arthritis Other   . Heart disease Other   . Mental illness Other   . Diabetes Other   . Hypertension Other   . Alcohol abuse Other   . Hyperlipidemia Other   . Heart disease Other   . Hypertension Other   . Mental illness Other   . Diabetes Other     History  Substance Use  Topics  . Smoking status: Current Everyday Smoker -- 1.0 packs/day  . Smokeless tobacco: Never Used  . Alcohol Use: No    OB History    Grav Para Term Preterm Abortions TAB SAB Ect Mult Living                  Review of Systems  Constitutional: Negative for chills and fatigue.  HENT: Negative for congestion, sinus pressure and ear discharge.   Eyes: Negative for discharge.  Respiratory: Positive for cough.   Cardiovascular: Negative for chest pain.  Gastrointestinal: Negative for abdominal pain and diarrhea.  Genitourinary: Negative for frequency and hematuria.  Musculoskeletal: Negative for back pain.  Skin: Negative for rash.  Neurological: Negative for seizures and headaches.  Hematological: Negative.   Psychiatric/Behavioral: Negative for hallucinations.    Allergies  Review of patient's allergies indicates no known allergies.  Home Medications   Current Outpatient Rx  Name Route Sig Dispense Refill  . AMLODIPINE BESYLATE 10 MG PO TABS Oral Take 10 mg by mouth at bedtime.    . ASPIRIN EC 81 MG PO TBEC Oral Take 81 mg by mouth at bedtime.    . FLUOXETINE HCL 40 MG PO  CAPS Oral Take 40 mg by mouth at bedtime.    Marland Kitchen HYDROCODONE-ACETAMINOPHEN 5-500 MG PO TABS Oral Take 1 tablet by mouth every 6 (six) hours as needed.    . IBUPROFEN 200 MG PO TABS Oral Take 400 mg by mouth every 6 (six) hours as needed. For pain    . LISINOPRIL 20 MG PO TABS Oral Take 20 mg by mouth at bedtime.    Marland Kitchen PRAMIPEXOLE DIHYDROCHLORIDE 0.25 MG PO TABS Oral Take 1 tablet (0.25 mg total) by mouth at bedtime. 90 tablet 3  . TRAMADOL HCL 50 MG PO TABS Oral Take 1 tablet (50 mg total) by mouth every 6 (six) hours as needed. For pain 120 tablet 2  . TRAZODONE HCL 50 MG PO TABS Oral Take 50-100 mg by mouth at bedtime.    Marland Kitchen VARENICLINE TARTRATE 0.5 MG X 11 & 1 MG X 42 PO MISC  Take one 0.5 mg tablet by mouth once daily for 3 days, then increase to one 0.5 mg tablet twice daily for 4 days, then increase to one  1 mg tablet twice daily. 53 tablet 0  . VARENICLINE TARTRATE 1 MG PO TABS Oral Take 1-3 mg by mouth daily.    . ALBUTEROL SULFATE HFA 108 (90 BASE) MCG/ACT IN AERS Inhalation Inhale 1-2 puffs into the lungs every 6 (six) hours as needed for wheezing. 1 Inhaler 0  . ALBUTEROL SULFATE (2.5 MG/3ML) 0.083% IN NEBU Nebulization Take 3 mLs (2.5 mg total) by nebulization every 6 (six) hours as needed. For shortness of breath 75 mL 11  . AMOXICILLIN 500 MG PO CAPS Oral Take 1 capsule (500 mg total) by mouth 3 (three) times daily. 21 capsule 0  . PREDNISONE 10 MG PO TABS  One tablet 2 times a day 15 tablet 0    BP 150/67  Pulse 72  Resp 18  Ht 5\' 2"  (1.575 m)  Wt 125 lb (56.7 kg)  BMI 22.86 kg/m2  SpO2 97%  Physical Exam  Constitutional: She is oriented to person, place, and time. She appears well-developed.  HENT:  Head: Normocephalic and atraumatic.  Eyes: Conjunctivae and EOM are normal. No scleral icterus.  Neck: Neck supple. No thyromegaly present.  Cardiovascular: Normal rate and regular rhythm.  Exam reveals no gallop and no friction rub.   No murmur heard. Pulmonary/Chest: No stridor. She has wheezes. She has no rales. She exhibits no tenderness.  Abdominal: She exhibits no distension. There is no tenderness. There is no rebound.  Musculoskeletal: Normal range of motion. She exhibits no edema.  Lymphadenopathy:    She has no cervical adenopathy.  Neurological: She is oriented to person, place, and time. Coordination normal.  Skin: No rash noted. No erythema.  Psychiatric: She has a normal mood and affect. Her behavior is normal.    ED Course  Procedures (including critical care time)  Labs Reviewed  CBC WITH DIFFERENTIAL - Abnormal; Notable for the following:    WBC 19.0 (*)     Platelets 461 (*)     Neutro Abs 12.7 (*)     Lymphs Abs 4.3 (*)     Monocytes Absolute 1.6 (*)     All other components within normal limits  COMPREHENSIVE METABOLIC PANEL - Abnormal; Notable for  the following:    Sodium 133 (*)     BUN 25 (*)     Creatinine, Ser 1.30 (*)     Albumin 3.4 (*)     Alkaline Phosphatase 130 (*)  Total Bilirubin 0.2 (*)     GFR calc non Af Amer 42 (*)     GFR calc Af Amer 49 (*)     All other components within normal limits  URINALYSIS, ROUTINE W REFLEX MICROSCOPIC - Abnormal; Notable for the following:    Color, Urine STRAW (*)     Specific Gravity, Urine <1.005 (*)     Hgb urine dipstick TRACE (*)     All other components within normal limits  URINE MICROSCOPIC-ADD ON   Dg Abd Acute W/chest  02/11/2012  *RADIOLOGY REPORT*  Clinical Data: Pain  ACUTE ABDOMEN SERIES (ABDOMEN 2 VIEW & CHEST 1 VIEW)  Comparison: 10/09/2011  Findings: Normal heart size.  Lungs are hyperaerated and clear.  No free intraperitoneal gas in the abdomen.  No disproportionate dilatation of bowel.  Nonspecific air fluid levels.  IMPRESSION: No active cardiopulmonary disease.  Nonobstructive bowel gas pattern.  Original Report Authenticated By: Donavan Burnet, M.D.     1. COPD (chronic obstructive pulmonary disease)       MDM       Pt states she was on prednisone but stopped yesterday because she lost her meds.  Benny Lennert, MD 02/11/12 2115  Benny Lennert, MD 02/11/12 2115

## 2012-02-11 NOTE — ED Notes (Signed)
Patient acquiring about time she goes to CT. Informed they would come get her at 1815.

## 2012-02-14 ENCOUNTER — Encounter: Payer: Self-pay | Admitting: Internal Medicine

## 2012-02-14 ENCOUNTER — Ambulatory Visit (INDEPENDENT_AMBULATORY_CARE_PROVIDER_SITE_OTHER): Payer: PRIVATE HEALTH INSURANCE | Admitting: Internal Medicine

## 2012-02-14 VITALS — BP 126/72 | HR 88 | Ht 62.0 in | Wt 125.0 lb

## 2012-02-14 DIAGNOSIS — Z860100 Personal history of colon polyps, unspecified: Secondary | ICD-10-CM

## 2012-02-14 DIAGNOSIS — K219 Gastro-esophageal reflux disease without esophagitis: Secondary | ICD-10-CM

## 2012-02-14 DIAGNOSIS — K559 Vascular disorder of intestine, unspecified: Secondary | ICD-10-CM | POA: Insufficient documentation

## 2012-02-14 DIAGNOSIS — R1013 Epigastric pain: Secondary | ICD-10-CM

## 2012-02-14 DIAGNOSIS — R1310 Dysphagia, unspecified: Secondary | ICD-10-CM

## 2012-02-14 MED ORDER — PANTOPRAZOLE SODIUM 40 MG PO TBEC: 40.0000 mg | DELAYED_RELEASE_TABLET | Freq: Every day | ORAL | Status: DC

## 2012-02-14 MED ORDER — PEG-KCL-NACL-NASULF-NA ASC-C 100 G PO SOLR: 1.0000 | Freq: Once | ORAL | Status: DC

## 2012-02-14 NOTE — Progress Notes (Signed)
Patient ID: Cheryl Mcguire, female   DOB: 03-18-1946, 66 y.o.   MRN: [id number]  SUBJECTIVE: HPI Cheryl Mcguire is a 66 yo female with PMH of COPD, hypertension, hyperlipidemia, remote CVA, peripheral vascular disease, history of colon polyps, history of ischemic colitis, and GERD who is seen in consultation at the request of Dr. Jonny Ruiz for evaluation of surveillance colonoscopy. The patient reports she is doing okay today. She has recently moved to [facility] from [place] to help take care of her mother. Her mother has recently had a stroke. From a GI standpoint, she does report some epigastric abdominal discomfort after eating. She describes this as a "raw feeling". She was previously on Protonix which seemed to help with this discomfort, but has run out of this medication for the last several months. She does be okay, her appetite has been okay. She reports occasional left flank pain which she relates to constipation. She has had some alternating bowel habits, but usually her stools are loose. However she will go 4-5 days without having a bowel movement. She does report sometimes avoiding passing stool despite having the urge. She feels that this is because she is always worried that she may again develop ischemic colitis. She was hospitalized for ischemic colitis several years ago. She has not seen blood in her stool recently and no melena. She reports significant stress as it relates to her family and finances. No fevers or chills. She is working to stop smoking and has recently been given a prescription for Chantix.  She has not yet started this.  She reports a history of esophageal dilation and a history of a food impaction. She notes that she notices mild solid food dysphagia. This seems to be intermittent. She occasionally has heartburn but not severe. Last esophageal dilation was "many years ago"  She feels her last colonoscopy for polyp surveillance was about 5 years ago. She's had several  colonoscopies over her life, these records are not available for review, but she reports she has polyps at each exam.  Review of Systems  As per history of present illness, otherwise negative   Past Medical History  Diagnosis Date  . Genital warts   . Heart disease   . History of colon polyps   . UTI (lower urinary tract infection)   . Allergy   . Arthritis   . Vulva cancer 1975  . COPD (chronic obstructive pulmonary disease)   . Depression   . Emphysema of lung   . Hypertension   . Hyperlipidemia   . Multinodular goiter (nontoxic)   . 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 Ischemia, bowel   . Carotid artery stenosis     bilateral    Current Outpatient Prescriptions  Medication Sig Dispense Refill  . albuterol (PROVENTIL HFA;VENTOLIN HFA) 108 (90 BASE) MCG/ACT inhaler Inhale 1-2 puffs into the lungs every 6 (six) hours as needed for wheezing.  1 Inhaler  0  . albuterol (PROVENTIL) (2.5 MG/3ML) 0.083% nebulizer solution Take 3 mLs (2.5 mg total) by nebulization every 6 (six) hours as needed. For shortness of breath  75 mL  11  . amLODipine (NORVASC) 10 MG tablet Take 10 mg by mouth at bedtime.      Marland Kitchen amoxicillin (AMOXIL) 500 MG capsule Take 1 capsule (500 mg total) by mouth 3 (three) times daily.  21 capsule  0  . aspirin EC 81 MG tablet Take 81 mg by mouth at bedtime.      Marland Kitchen  clopidogrel (PLAVIX) 75 MG tablet Take 75 mg by mouth daily.      Marland Kitchen FLUoxetine (PROZAC) 40 MG capsule Take 40 mg by mouth at bedtime.      Marland Kitchen HYDROcodone-acetaminophen (VICODIN) 5-500 MG per tablet Take 1 tablet by mouth every 6 (six) hours as needed.      Marland Kitchen ibuprofen (ADVIL,MOTRIN) 200 MG tablet Take 400 mg by mouth every 6 (six) hours as needed. For pain      . lisinopril (PRINIVIL,ZESTRIL) 20 MG tablet Take 20 mg by mouth at bedtime.      . pramipexole (MIRAPEX) 0.25 MG tablet Take 1 tablet (0.25 mg total) by mouth at bedtime.  90 tablet  3  . predniSONE (DELTASONE) 10 MG tablet One tablet 2  times a day  15 tablet  0  . traMADol (ULTRAM) 50 MG tablet Take 1 tablet (50 mg total) by mouth every 6 (six) hours as needed. For pain  120 tablet  2  . traZODone (DESYREL) 50 MG tablet Take 50-100 mg by mouth at bedtime.      . pantoprazole (PROTONIX) 40 MG tablet Take 1 tablet (40 mg total) by mouth daily.  30 tablet  1  . peg 3350 powder (MOVIPREP) 100 G SOLR Take 1 kit (100 g total) by mouth once.  1 kit  0  . varenicline (CHANTIX STARTING MONTH PAK) 0.5 MG X 11 & 1 MG X 42 tablet Take one 0.5 mg tablet by mouth once daily for 3 days, then increase to one 0.5 mg tablet twice daily for 4 days, then increase to one 1 mg tablet twice daily.  53 tablet  0  . varenicline (CHANTIX) 1 MG tablet Take 1-3 mg by mouth daily.        No Known Allergies  Family History  Problem Relation Age of Onset  . Alcohol abuse Other   . Arthritis Other   . Heart disease Other   . Stroke Other   . Kidney disease Other   . Diabetes Other   . Hypertension Other   . Arthritis Other   . Heart disease Other   . Mental illness Other   . Diabetes Other   . Hypertension Other   . Alcohol abuse Other   . Hyperlipidemia Other   . Heart disease Other   . Hypertension Other   . Mental illness Other   . Diabetes Other   . Colon cancer Neg Hx   . Colon polyps Sister     History  Substance Use Topics  . Smoking status: Current Everyday Smoker -- 1.0 packs/day  . Smokeless tobacco: Never Used  . Alcohol Use: No    OBJECTIVE: BP 126/72  Pulse 88  Ht 5\' 2"  (1.575 m)  Wt 125 lb (56.7 kg)  BMI 22.86 kg/m2 Constitutional: Well-developed and well-nourished. No distress. Smells of tobacco HEENT: Normocephalic and atraumatic. Oropharynx is clear and moist. No oropharyngeal exudate. Conjunctivae are normal.  No scleral icterus. Neck: Neck supple. Trachea midline. Cardiovascular: Normal rate, regular rhythm and intact distal pulses. No M/R/G Pulmonary/chest: Effort normal and breath sounds somewhat distant,  but No wheezing, rales or rhonchi. Abdominal: Soft, mild epigastric tenderness to deep palpation without rebound or guarding, nondistended. Well-healed right upper quadrant scar. Bowel sounds active throughout. There are no masses palpable. No hepatosplenomegaly. Extremities: no clubbing, cyanosis, or edema Lymphadenopathy: No cervical adenopathy noted. Neurological: Alert and oriented to person place and time. Skin: Skin is warm and dry. No rashes noted. Psychiatric: Normal  mood and affect. Behavior is normal.  Labs and Imaging -- CBC    Component Value Date/Time   WBC 19.0* 02/11/2012 1720   RBC 4.53 02/11/2012 1720   HGB 12.8 02/11/2012 1720   HCT 38.8 02/11/2012 1720   PLT 461* 02/11/2012 1720   MCV 85.7 02/11/2012 1720   MCH 28.3 02/11/2012 1720   MCHC 33.0 02/11/2012 1720   RDW 14.3 02/11/2012 1720   LYMPHSABS 4.3* 02/11/2012 1720   MONOABS 1.6* 02/11/2012 1720   EOSABS 0.4 02/11/2012 1720   BASOSABS 0.0 02/11/2012 1720    CMP     Component Value Date/Time   NA 133* 02/11/2012 1720   K 3.8 02/11/2012 1720   CL 97 02/11/2012 1720   CO2 23 02/11/2012 1720   GLUCOSE 96 02/11/2012 1720   BUN 25* 02/11/2012 1720   CREATININE 1.30* 02/11/2012 1720   CALCIUM 9.3 02/11/2012 1720   PROT 6.5 02/11/2012 1720   ALBUMIN 3.4* 02/11/2012 1720   AST 10 02/11/2012 1720   ALT 7 02/11/2012 1720   ALKPHOS 130* 02/11/2012 1720   BILITOT 0.2* 02/11/2012 1720   GFRNONAA 42* 02/11/2012 1720   GFRAA 49* 02/11/2012 1720    ASSESSMENT AND PLAN:  66 yo female with PMH of COPD, hypertension, hyperlipidemia, remote CVA, peripheral vascular disease, history of colon polyps, history of ischemic colitis, and GERD who is seen in consultation at the request of Dr. Jonny Ruiz for evaluation of surveillance colonoscopy.  1. Hx of colon polyps -- the patient has a history of colon polyps and we have requested her previous colonoscopy and pathology reports. It does seem she's due for repeat surveillance colonoscopy, and this will be scheduled for her. We  discussed the risks and benefits and she is agreeable to proceed. She's had long-standing loose stool, and I have encouraged her not to avoid having a bowel movement when she has the urge. This may be leading to some of her left-sided, intermittent, abdominal discomfort. I will start her on Align one capsule daily to try to help regulate her bowel habits.  2. Epigastric discomfort/intermittent dysphagia -- I'll restart her pantoprazole 40 mg daily. She is reminded to take this 30 minutes to one hour before breakfast. Given her history of esophageal stricture and need for dilation, and somewhat recurrent dysphagia, I recommended upper endoscopy. This can be done at the same time as her colonoscopy.  She is on Plavix, and we already have permission from Dr. Jonny Ruiz to hold this medication prior to procedures. She will be instructed to hold Plavix 5 days before upper endoscopy and colonoscopy.  3. Tobacco use -- One of your biggest health concerns is your smoking.  This increases your risk for most cancers and serious cardiovascular diseases such as strokes, heart attacks.  You should try your best to stop.  If you need assistance, please contact your PCP or Smoking Cessation Class at [facility] [phone]) or [facility] Quit-Line (1-800-QUIT-NOW).   Further recommendations after procedure

## 2012-02-14 NOTE — Patient Instructions (Addendum)
You have been given a separate informational sheet regarding your tobacco use, the importance of quitting and local resources to help you quit.  You have been scheduled for a colonoscopy with propofol. Please follow written instructions given to you at your visit today.  Please pick up your prep kit at the pharmacy within the next 1-3 days. If you use inhalers (even only as needed), please bring them with you on the day of your procedure.   We have sent the following medications to your pharmacy for you to pick up at your convenience: Align and Protonix Please take as directed.   STOP TAKING PLAVIX 5 days prior to your procedure.

## 2012-03-03 ENCOUNTER — Ambulatory Visit: Payer: PRIVATE HEALTH INSURANCE | Admitting: Endocrinology

## 2012-03-04 ENCOUNTER — Telehealth: Payer: Self-pay | Admitting: Gastroenterology

## 2012-03-04 ENCOUNTER — Telehealth: Payer: Self-pay | Admitting: *Deleted

## 2012-03-04 NOTE — Telephone Encounter (Signed)
Spoke to pt. Offered her an earlier appointment for her colonoscopy with Dr. Rhea Belton on 03/05/2012 she was happy to be able to come earlier. Told her her procedure is scheduled for 10:30am and to arrive at 9:30am. To drink her prep tonight as instructed. Drink the 2nd half of the prep 5 hours prior to her procedure. She can drink clear liquids until 8:30am. Pt stated understanding.

## 2012-03-04 NOTE — Telephone Encounter (Signed)
Pt called and stated that she has been eating today.  States that she has not eaten much but had some walnuts.  Advised her to stop eating and to stay on clear liquids the remainder of the day.  She will start her prep and drink plenty of fluids the rest of the day.  Pt verbalized understanding.

## 2012-03-05 ENCOUNTER — Encounter: Payer: PRIVATE HEALTH INSURANCE | Admitting: Internal Medicine

## 2012-03-05 ENCOUNTER — Ambulatory Visit (AMBULATORY_SURGERY_CENTER): Payer: PRIVATE HEALTH INSURANCE | Admitting: Internal Medicine

## 2012-03-05 ENCOUNTER — Encounter: Payer: Self-pay | Admitting: Internal Medicine

## 2012-03-05 VITALS — BP 147/71 | HR 82 | Temp 98.2°F | Resp 17 | Ht 62.0 in | Wt 125.0 lb

## 2012-03-05 DIAGNOSIS — R1013 Epigastric pain: Secondary | ICD-10-CM

## 2012-03-05 DIAGNOSIS — Q391 Atresia of esophagus with tracheo-esophageal fistula: Secondary | ICD-10-CM

## 2012-03-05 DIAGNOSIS — D126 Benign neoplasm of colon, unspecified: Secondary | ICD-10-CM

## 2012-03-05 DIAGNOSIS — K2980 Duodenitis without bleeding: Secondary | ICD-10-CM

## 2012-03-05 DIAGNOSIS — Z86010 Personal history of colonic polyps: Secondary | ICD-10-CM

## 2012-03-05 DIAGNOSIS — K635 Polyp of colon: Secondary | ICD-10-CM

## 2012-03-05 DIAGNOSIS — K222 Esophageal obstruction: Secondary | ICD-10-CM

## 2012-03-05 DIAGNOSIS — R1310 Dysphagia, unspecified: Secondary | ICD-10-CM

## 2012-03-05 DIAGNOSIS — K319 Disease of stomach and duodenum, unspecified: Secondary | ICD-10-CM

## 2012-03-05 DIAGNOSIS — K219 Gastro-esophageal reflux disease without esophagitis: Secondary | ICD-10-CM

## 2012-03-05 DIAGNOSIS — Z1211 Encounter for screening for malignant neoplasm of colon: Secondary | ICD-10-CM

## 2012-03-05 MED ORDER — SODIUM CHLORIDE 0.9 % IV SOLN: 500.0000 mL | INTRAVENOUS | Status: DC

## 2012-03-05 NOTE — Op Note (Signed)
[facility] [address] [place], [place]  [address]  ENDOSCOPY PROCEDURE REPORT  PATIENT:  Cheryl, Mcguire  MR#:  [id number] BIRTHDATE:  February 24, 1946, 65 yrs. old  GENDER:  female ENDOSCOPIST:  Carie Caddy. Demira Gwynne, MD Referred by:  Oliver Barre, M.D. PROCEDURE DATE:  03/05/2012 PROCEDURE:  EGD with dilatation over guidewire, EGD with biopsy for H. pylori 19147 ASA CLASS:  Class III INDICATIONS:  dysphagia, epigastric pain MEDICATIONS:   MAC sedation, administered by CRNA, propofol (Diprivan) 200 mg IV TOPICAL ANESTHETIC:  Cetacaine Spray  DESCRIPTION OF PROCEDURE:   After the risks benefits and alternatives of the procedure were thoroughly explained, informed consent was obtained.  The [facility] GIF-H180 [id number] endoscope was introduced through the mouth and advanced to the second portion of the duodenum, without limitations.  The instrument was slowly withdrawn as the mucosa was fully examined. <<PROCEDUREIMAGES>>  A Schatzki's ring was found at the gastroesophageal junction (36 cm from the incisors).  Dilation was performed using a savary passed over a guidewire.  One pass made with 16 mm dilator.  A 4 cm hiatal hernia was found.  Mild gastritis was found antrum. Biopsies of the antrum and body of the stomach were obtained and sent to pathology.  Mild peptic-appearing duodenitis was found in the bulb of the duodenum.  Normal otherwise in the second portion of the duodenum.    Retroflexed views revealed findings as previously described.    The scope was then withdrawn from the patient and the procedure completed.  COMPLICATIONS:  None  ENDOSCOPIC IMPRESSION: 1) Schatzki's ring at the gastroesophageal junction.  Dilation performed with Savory to 16 mm 2) 4 cm hiatal hernia 3) Mild gastritis in the antrum.  Multiple biopsies obtained and sent to pathology. 4) Mild duodenitis in the bulb of duodenum 5) Normal in the second portion duodenum  RECOMMENDATIONS: 1) Await  pathology results 2) avoid NSAIDS 3) follow-up of helicobacter pylori status, treat if indicated 4) Continue taking your PPI (antiacid medicine called pantoprazole) once daily. It is best to be taken 20-30 minutes prior to breakfast meal.  [name], MD  CC:  [name], MD The Patient  n. eSIGNED:   Carie Caddy. Keatin Benham at 03/05/2012 11:22 AM  [name], [phone]

## 2012-03-05 NOTE — Progress Notes (Signed)
No complaints noted in the recovery room. Maw  Patient did not experience any of the following events: a burn prior to discharge; a fall within the facility; wrong site/side/patient/procedure/implant event; or a hospital transfer or hospital admission upon discharge from the facility. (G8907) Patient did not have preoperative order for IV antibiotic SSI prophylaxis. (G8918)  

## 2012-03-05 NOTE — Patient Instructions (Addendum)
Handouts were given to your care partner on dilatation diet, hiatal hernia, gastritis, polyps, hemorrhoid and high fiber diet.  Please hold aspirin, aspirin products and anti-inflammatory medications for 1 week, until March 12, 2012.  Continue taking your acid reducing medication.  You may resume your other medication today.  Please call if any questions or concerns.    YOU HAD AN ENDOSCOPIC PROCEDURE TODAY AT THE [facility]: Refer to the procedure report that was given to you for any specific questions about what was found during the examination.  If the procedure report does not answer your questions, please call your gastroenterologist to clarify.  If you requested that your care partner not be given the details of your procedure findings, then the procedure report has been included in a sealed envelope for you to review at your convenience later.  YOU SHOULD EXPECT: Some feelings of bloating in the abdomen. Passage of more gas than usual.  Walking can help get rid of the air that was put into your GI tract during the procedure and reduce the bloating. If you had a lower endoscopy (such as a colonoscopy or flexible sigmoidoscopy) you may notice spotting of blood in your stool or on the toilet paper. If you underwent a bowel prep for your procedure, then you may not have a normal bowel movement for a few days.  DIET: Please follow the dilatation diet the rest of the day.   Drink plenty of fluids but you should avoid alcoholic beverages for 24 hours.  ACTIVITY: Your care partner should take you home directly after the procedure.  You should plan to take it easy, moving slowly for the rest of the day.  You can resume normal activity the day after the procedure however you should NOT DRIVE or use heavy machinery for 24 hours (because of the sedation medicines used during the test).    SYMPTOMS TO REPORT IMMEDIATELY: A gastroenterologist can be reached at any hour.  During normal business  hours, 8:30 AM to 5:00 PM Monday through Friday, call [phone].  After hours and on weekends, please call the GI answering service at [phone] who will take a message and have the physician on call contact you.   Following lower endoscopy (colonoscopy or flexible sigmoidoscopy):  Excessive amounts of blood in the stool  Significant tenderness or worsening of abdominal pains  Swelling of the abdomen that is new, acute  Fever of 100F or higher  Following upper endoscopy (EGD)  Vomiting of blood or coffee ground material  New chest pain or pain under the shoulder blades  Painful or persistently difficult swallowing  New shortness of breath  Fever of 100F or higher  Black, tarry-looking stools  FOLLOW UP: If any biopsies were taken you will be contacted by phone or by letter within the next 1-3 weeks.  Call your gastroenterologist if you have not heard about the biopsies in 3 weeks.  Our staff will call the home number listed on your records the next business day following your procedure to check on you and address any questions or concerns that you may have at that time regarding the information given to you following your procedure. This is a courtesy call and so if there is no answer at the home number and we have not heard from you through the emergency physician on call, we will assume that you have returned to your regular daily activities without incident.  SIGNATURES/CONFIDENTIALITY: You and/or your care partner have signed  paperwork which will be entered into your electronic medical record.  These signatures attest to the fact that that the information above on your After Visit Summary has been reviewed and is understood.  Full responsibility of the confidentiality of this discharge information lies with you and/or your care-partner.

## 2012-03-05 NOTE — Op Note (Signed)
[facility] [address] [place], [place]  [address]  COLONOSCOPY PROCEDURE REPORT  PATIENT:  Cheryl, Mcguire  MR#:  [id number] BIRTHDATE:  10-Nov-1945, 65 yrs. old  GENDER:  female ENDOSCOPIST:  Carie Caddy. Dioselina Brumbaugh, MD REF. BY:  Oliver Barre, M.D. PROCEDURE DATE:  03/05/2012 PROCEDURE:  Colonoscopy with snare polypectomy ASA CLASS:  Class III INDICATIONS:  surveillance and high-risk screening MEDICATIONS:   MAC sedation, administered by CRNA, propofol (Diprivan) 250 mg IV  DESCRIPTION OF PROCEDURE:   After the risks benefits and alternatives of the procedure were thoroughly explained, informed consent was obtained.  Digital rectal exam was performed and revealed no rectal masses.   The LB [id number] endoscope was introduced through the anus and advanced to the cecum, which was identified by both the appendix and ileocecal valve, without limitations.  The quality of the prep was good, using MiraLax. The instrument was then slowly withdrawn as the colon was fully examined. <<PROCEDUREIMAGES>>  FINDINGS:  There were multiple flat and sessile polyps identified in the rectum and sigmoid colon. Eight of the largest polyps, measuring 4 - 7 mm were snared without cautery. Retrieval was successful.  Given the number of polyps, a representative sample was taken.   Mild diverticulosis was found in the sigmoid colon. Mild melanosis coli was found throughout the colon.   Retroflexed views in the rectum revealed internal hemorrhoids.    The scope was then withdrawn from the cecum and the procedure completed.  COMPLICATIONS:  None ENDOSCOPIC IMPRESSION: 1) Polyps, multiple in the rectum and sigmoid colon.  Eight polyps removed and sent to pathology. 2) Mild diverticulosis in the sigmoid colon 3) Mild melanosis throughout the colon 4) Internal hemorrhoids  RECOMMENDATIONS: 1) Hold aspirin, aspirin products, and anti-inflammatory medication for 1 week. 2) Await pathology  results 3) High fiber diet. 4) Timing of repeat colonoscopy will be determined by pathology findings. 5) You will receive a letter within 1-2 weeks with the results of your biopsy as well as final recommendations. Please call my office if you have not received a letter after 3 weeks. 6) If constipation is an issue, I recommend Miralax 17 grams daily.  [name], MD  CC:  The Patient [name], MD  n. [name] at 03/05/2012 11:27 AM  [name], [phone]

## 2012-03-06 ENCOUNTER — Telehealth: Payer: Self-pay | Admitting: *Deleted

## 2012-03-06 NOTE — Telephone Encounter (Signed)
  Follow up Call-  Call back number 03/05/2012  Post procedure Call Back phone  # [phone]  Permission to leave phone message Yes     Patient questions:  Do you have a fever, pain , or abdominal swelling? no Pain Score  0 *  Have you tolerated food without any problems? yes  Have you been able to return to your normal activities? yes  Do you have any questions about your discharge instructions: Diet   no Medications  no Follow up visit  no  Do you have questions or concerns about your Care? no  Actions: * If pain score is 4 or above: No action needed, pain <4.

## 2012-03-11 ENCOUNTER — Telehealth: Payer: Self-pay | Admitting: Internal Medicine

## 2012-03-11 NOTE — Telephone Encounter (Signed)
Caller: Janda/Patient; PCP: Oliver Barre; CB#: [phone]; ; ; Call regarding Cough/Congestion;  Has cough since "3-4 weeks ago". Feels "feverish" but has not taken temp. No wheezing. Has pain when takes deep breath. Is coughing up yellow mucus. Has caused throat to be sore. Is drinking fluids.  Advised appointment and scheduled for 8-8 at

## 2012-03-12 ENCOUNTER — Ambulatory Visit (INDEPENDENT_AMBULATORY_CARE_PROVIDER_SITE_OTHER): Payer: PRIVATE HEALTH INSURANCE | Admitting: Internal Medicine

## 2012-03-12 ENCOUNTER — Encounter: Payer: Self-pay | Admitting: Internal Medicine

## 2012-03-12 VITALS — BP 120/62 | HR 75 | Temp 97.5°F | Ht 62.0 in | Wt 122.1 lb

## 2012-03-12 DIAGNOSIS — J029 Acute pharyngitis, unspecified: Secondary | ICD-10-CM

## 2012-03-12 DIAGNOSIS — J441 Chronic obstructive pulmonary disease with (acute) exacerbation: Secondary | ICD-10-CM

## 2012-03-12 DIAGNOSIS — F17200 Nicotine dependence, unspecified, uncomplicated: Secondary | ICD-10-CM

## 2012-03-12 MED ORDER — MOMETASONE FURO-FORMOTEROL FUM 200-5 MCG/ACT IN AERO: 1.0000 | INHALATION_SPRAY | Freq: Two times a day (BID) | RESPIRATORY_TRACT | Status: DC

## 2012-03-12 MED ORDER — PREDNISONE (PAK) 10 MG PO TABS: 10.0000 mg | ORAL_TABLET | ORAL | Status: AC

## 2012-03-12 MED ORDER — AMOXICILLIN-POT CLAVULANATE 875-125 MG PO TABS: 1.0000 | ORAL_TABLET | Freq: Two times a day (BID) | ORAL | Status: AC

## 2012-03-12 NOTE — Progress Notes (Signed)
  Subjective:    Patient ID: Cheryl Mcguire, female    DOB: 1945/12/11, 66 y.o.   MRN: [id number]  HPI  complains of cough associated with sore throat, green nasal discharge, shortness of breath and low grade fever Onset 1 month ago, wax and wane intensity Hx same with prior COPD flares and "summer colds" Endo last week (colo/EGD) exac symptoms per pt noncompliance with pulm meds due to $ continues to smoke, but has rx for chantix   Past Medical History  Diagnosis Date  . Genital warts   . Heart disease   . History of colon polyps   . Arthritis   . Vulva cancer 1975  . COPD (chronic obstructive pulmonary disease)   . Depression   . Emphysema of lung   . Hypertension   . Hyperlipidemia   . Multinodular goiter (nontoxic)   . 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 Ischemia, bowel   . Carotid artery stenosis     bilateral    Review of Systems  Constitutional: Positive for fatigue and unexpected weight change.  HENT: Positive for congestion and postnasal drip. Negative for nosebleeds and sinus pressure.   Respiratory: Positive for cough, chest tightness and shortness of breath. Negative for wheezing.   Cardiovascular: Negative for chest pain and palpitations.  Gastrointestinal: Positive for abdominal distention. Negative for abdominal pain.  Neurological: Negative for headaches.       Objective:   Physical Exam BP 120/62  Pulse 75  Temp 97.5 F (36.4 C) (Oral)  Ht 5\' 2"  (1.575 m)  Wt 122 lb 1.9 oz (55.393 kg)  BMI 22.34 kg/m2  SpO2 98% Wt Readings from Last 3 Encounters:  03/12/12 122 lb 1.9 oz (55.393 kg)  03/05/12 125 lb (56.7 kg)  02/14/12 125 lb (56.7 kg)   Constitutional: She smells of tobacco with graveley voice, but appears well-developed and well-nourished. No acute distress.  HENT: Head: Normocephalic and atraumatic. Sinus non tender Ears: B TMs ok, no erythema or effusion; Nose: Nose normal. Mouth/Throat: Oropharynx is red, oropharyngeal  exudate R>L.  Eyes: Conjunctivae and EOM are normal. Pupils are equal, round, and reactive to light. No scleral icterus.  Neck: Normal range of motion. Neck supple. No JVD present. Mild anterior R>L LAD. No thyromegaly present.  Cardiovascular: Normal rate, regular rhythm and normal heart sounds.  No murmur heard. No BLE edema. Pulmonary/Chest: Effort normal and breath sounds diminished B bases - scattered rhonchi and soft ed exp wheeze. No respiratory distress.  Skin: Skin is warm and dry. No rash noted. No erythema.  Psychiatric: She has a normal mood and affect. Her behavior is normal. Judgment and thought content normal.   Lab Results  Component Value Date   WBC 19.0* 02/11/2012   HGB 12.8 02/11/2012   HCT 38.8 02/11/2012   PLT 461* 02/11/2012   GLUCOSE 96 02/11/2012   CHOL 180 01/17/2012   TRIG 237.0* 01/17/2012   HDL 38.10* 01/17/2012   LDLDIRECT 116.9 01/17/2012   ALT 7 02/11/2012   AST 10 02/11/2012   NA 133* 02/11/2012   K 3.8 02/11/2012   CL 97 02/11/2012   CREATININE 1.30* 02/11/2012   BUN 25* 02/11/2012   CO2 23 02/11/2012   TSH 0.22* 01/17/2012        Assessment & Plan:   COPD acute exac, poor baseline control Ongoing tobacco abuse Acute pharyngitis, exuatative

## 2012-03-12 NOTE — Patient Instructions (Addendum)
It was good to see you today. pred pak and augmentin antibiotics  - Your prescription(s) have been submitted to your pharmacy. Please take as directed and contact our office if you believe you are having problem(s) with the medication(s). Continue your Dulera and use Albuterol as needed Continue to think about giving up cigarettes! Use Chantix, nicotine gum, nicotine patches or electronic cigarettes. If you're interested in medication to help you quit, please call  - let me know how I can help! Please schedule followup with Dr Jonny Ruiz in 3-4 weeks to review, call sooner if problems. Sore Throat A sore throat is felt inside the throat and at the back of the mouth. It hurts to swallow or the throat may feel dry and scratchy. It can be caused by germs, smoking, pollution, or allergies.   HOME CARE    Only take medicine as told by your doctor.   Drink enough fluids to keep your pee (urine) clear or pale yellow.   Eat soft foods.   Do not smoke.   Rinse the mouth (gargle) with warm water or salt water ( teaspoon salt in 8 ounces of water).   Try throat sprays, lozenges, or suck on hard candy.  GET HELP RIGHT AWAY IF:    You have trouble breathing.   Your sore throat lasts longer than 1 week.   There is more puffiness (swelling) in the throat.   The pain is so bad that you are unable to swallow.   You have a very bad headache or a red rash.   You start to throw up (vomit).   You or your child has a temperature by mouth above 102 F (38.9 C), not controlled by medicine.   Your baby is older than 3 months with a rectal temperature of 102 F (38.9 C) or higher.   Your baby is 1 months old or younger with a rectal temperature of 100.4 F (38 C) or higher.  MAKE SURE YOU:    Understand these instructions.   Will watch your condition.   Will get help right away if you are not doing well or get worse.  Document Released: 04/30/2008 Document Revised: 07/11/2011 Document Reviewed:  04/30/2008 [facility] Patient Information 2012 [place], [place].You Can Quit Smoking If you are ready to quit smoking or are thinking about it, congratulations! You have chosen to help yourself be healthier and live longer! There are lots of different ways to quit smoking. Nicotine gum, nicotine patches, a nicotine inhaler, or nicotine nasal spray can help with physical craving. Hypnosis, support groups, and medicines help break the habit of smoking. TIPS TO GET OFF AND STAY OFF CIGARETTES  Learn to predict your moods. Do not let a bad situation be your excuse to have a cigarette. Some situations in your life might tempt you to have a cigarette.   Ask friends and co-workers not to smoke around you.   Make your home smoke-free.   Never have "just one" cigarette. It leads to wanting another and another. Remind yourself of your decision to quit.   On a card, make a list of your reasons for not smoking. Read it at least the same number of times a day as you have a cigarette. Tell yourself everyday, "I do not want to smoke. I choose not to smoke."   Ask someone at home or work to help you with your plan to quit smoking.   Have something planned after you eat or have a cup of coffee. Take  a walk or get other exercise to perk you up. This will help to keep you from overeating.   Try a relaxation exercise to calm you down and decrease your stress. Remember, you may be tense and nervous the first two weeks after you quit. This will pass.   Find new activities to keep your hands busy. Play with a pen, coin, or rubber band. Doodle or draw things on paper.   Brush your teeth right after eating. This will help cut down the craving for the taste of tobacco after meals. You can try mouthwash too.   Try gum, breath mints, or diet candy to keep something in your mouth.  IF YOU SMOKE AND WANT TO QUIT:  Do not stock up on cigarettes. Never buy a carton. Wait until one pack is finished before you buy another.     Never carry cigarettes with you at work or at home.   Keep cigarettes as far away from you as possible. Leave them with someone else.   Never carry matches or a lighter with you.   Ask yourself, "Do I need this cigarette or is this just a reflex?"   Bet with someone that you can quit. Put cigarette money in a piggy bank every morning. If you smoke, you give up the money. If you do not smoke, by the end of the week, you keep the money.   Keep trying. It takes 21 days to change a habit!   Talk to your doctor about using medicines to help you quit. These include nicotine replacement gum, lozenges, or skin patches.  Document Released: 05/18/2009 Document Revised: 07/11/2011 Document Reviewed: 05/18/2009 [facility] Patient Information 2012 [place], [place].

## 2012-03-16 ENCOUNTER — Encounter: Payer: Self-pay | Admitting: Internal Medicine

## 2012-03-20 ENCOUNTER — Encounter: Payer: Self-pay | Admitting: Internal Medicine

## 2012-03-20 ENCOUNTER — Ambulatory Visit (INDEPENDENT_AMBULATORY_CARE_PROVIDER_SITE_OTHER): Payer: PRIVATE HEALTH INSURANCE | Admitting: Internal Medicine

## 2012-03-20 ENCOUNTER — Telehealth: Payer: Self-pay | Admitting: Internal Medicine

## 2012-03-20 VITALS — BP 132/62 | HR 79 | Temp 98.0°F | Ht 62.0 in | Wt 122.5 lb

## 2012-03-20 DIAGNOSIS — Z01818 Encounter for other preprocedural examination: Secondary | ICD-10-CM

## 2012-03-20 DIAGNOSIS — F411 Generalized anxiety disorder: Secondary | ICD-10-CM

## 2012-03-20 DIAGNOSIS — J449 Chronic obstructive pulmonary disease, unspecified: Secondary | ICD-10-CM

## 2012-03-20 DIAGNOSIS — F419 Anxiety disorder, unspecified: Secondary | ICD-10-CM

## 2012-03-20 DIAGNOSIS — I10 Essential (primary) hypertension: Secondary | ICD-10-CM

## 2012-03-20 DIAGNOSIS — F329 Major depressive disorder, single episode, unspecified: Secondary | ICD-10-CM

## 2012-03-20 DIAGNOSIS — R5383 Other fatigue: Secondary | ICD-10-CM

## 2012-03-20 DIAGNOSIS — K219 Gastro-esophageal reflux disease without esophagitis: Secondary | ICD-10-CM

## 2012-03-20 DIAGNOSIS — R5381 Other malaise: Secondary | ICD-10-CM

## 2012-03-20 MED ORDER — CLONAZEPAM 0.5 MG PO TABS: 0.5000 mg | ORAL_TABLET | Freq: Two times a day (BID) | ORAL | Status: DC | PRN

## 2012-03-20 MED ORDER — DEXLANSOPRAZOLE 60 MG PO CPDR: 60.0000 mg | DELAYED_RELEASE_CAPSULE | Freq: Every day | ORAL | Status: DC

## 2012-03-20 NOTE — Progress Notes (Signed)
Subjective:    Patient ID: Cheryl Mcguire, female    DOB: Feb 27, 1946, 66 y.o.   MRN: [id number]  HPI  Here for preop eval, due for right rotater cuff repair soon;  Here to f/u; overall doing ok,  Pt denies chest pain, increased sob or doe, wheezing, orthopnea, PND, increased LE swelling, palpitations, or syncope.  Pt denies new neurological symptoms such as new headache, or facial or extremity weakness or numbness   Pt denies polydipsia, polyuria,   Pt states overall good compliance with meds, trying to follow lower cholesterol diet, wt overall stable.  Does have sense of ongoing fatigue x 2 wks, but denies signficant hypersomnolence.  Has occasional dizziness after recent URI in the past 2 wks,"I feel drugged", tends to sleep during the day for the past 2 wks only, but also increased stress on top of ongoing depressive symptoms with good compliaince with prozac, living with her demented mother who is now falling more with freq incontinence as well.  Last stress test approx 1 yr ago, neg per pt.  Also current PPI may not be working as well in the past 2-3 months with nausea and occasional reflux, but Denies dysphagia, abd pain, n/v, bowel change or blood. Mother often awakens her several times at night   Past Medical History  Diagnosis Date  . Genital warts   . Heart disease   . History of colon polyps   . Arthritis   . Vulva cancer 1975  . COPD (chronic obstructive pulmonary disease)   . Depression   . Emphysema of lung   . Hypertension   . Hyperlipidemia   . Multinodular goiter (nontoxic)   . 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 Ischemia, bowel   . Carotid artery stenosis     bilateral   Past Surgical History  Procedure Date  . Cholecystectomy   . Tubal ligation   . Appendectomy   . Carpal tunnel release   . Nose surgery   . Abdominal hysterectomy 1964    reports that she has been smoking.  She has never used smokeless tobacco. She reports that she does not drink  alcohol or use illicit drugs. family history includes Alcohol abuse in her others; Arthritis in her others; Colon polyps in her sister; Diabetes in her others; Heart disease in her others; Hyperlipidemia in her other; Hypertension in her others; Kidney disease in her other; Mental illness in her others; and Stroke in her other.  There is no history of Colon cancer. No Known Allergies Current Outpatient Prescriptions on File Prior to Visit  Medication Sig Dispense Refill  . albuterol (PROVENTIL HFA;VENTOLIN HFA) 108 (90 BASE) MCG/ACT inhaler Inhale 1-2 puffs into the lungs every 6 (six) hours as needed for wheezing.  1 Inhaler  0  . albuterol (PROVENTIL) (2.5 MG/3ML) 0.083% nebulizer solution Take 3 mLs (2.5 mg total) by nebulization every 6 (six) hours as needed. For shortness of breath  75 mL  11  . amLODipine (NORVASC) 10 MG tablet Take 10 mg by mouth at bedtime.      Marland Kitchen FLUoxetine (PROZAC) 40 MG capsule Take 40 mg by mouth at bedtime.      Marland Kitchen ibuprofen (ADVIL,MOTRIN) 200 MG tablet Take 400 mg by mouth every 6 (six) hours as needed. For pain      . lisinopril (PRINIVIL,ZESTRIL) 20 MG tablet Take 20 mg by mouth at bedtime.      . Mometasone Furo-Formoterol Fum (DULERA) 200-5  MCG/ACT AERO Inhale 1 puff into the lungs 2 (two) times daily.  13 g  1  . pantoprazole (PROTONIX) 40 MG tablet Take 1 tablet (40 mg total) by mouth daily.  30 tablet  1  . pramipexole (MIRAPEX) 0.25 MG tablet Take 1 tablet (0.25 mg total) by mouth at bedtime.  90 tablet  3  . traMADol (ULTRAM) 50 MG tablet Take 1 tablet (50 mg total) by mouth every 6 (six) hours as needed. For pain  120 tablet  2  . traZODone (DESYREL) 50 MG tablet Take 50-100 mg by mouth at bedtime.      Marland Kitchen amoxicillin-clavulanate (AUGMENTIN) 875-125 MG per tablet Take 1 tablet by mouth 2 (two) times daily.  14 tablet  0  . aspirin EC 81 MG tablet Take 81 mg by mouth at bedtime.      . clonazePAM (KLONOPIN) 0.5 MG tablet Take 1 tablet (0.5 mg total) by mouth 2  (two) times daily as needed for anxiety.  60 tablet  1  . clopidogrel (PLAVIX) 75 MG tablet Take 75 mg by mouth daily.      Marland Kitchen dexlansoprazole (DEXILANT) 60 MG capsule Take 1 capsule (60 mg total) by mouth daily.  90 capsule  3  . predniSONE (STERAPRED UNI-PAK) 10 MG tablet Take 1 tablet (10 mg total) by mouth as directed. As directed x 6 days  21 tablet  0   Review of Systems Review of Systems  Constitutional: Negative for diaphoresis and unexpected weight change.  HENT: Negative for drooling and tinnitus.   Eyes: Negative for photophobia and visual disturbance.  Respiratory: Negative for choking and stridor.   Gastrointestinal: Negative for vomiting and blood in stool.  Genitourinary: Negative for hematuria and decreased urine volume.  Musculoskeletal: Negative for gait problem.  Skin: Negative for color change and wound.  Neurological: Negative for tremors and numbness.  Psychiatric/Behavioral: Negative for decreased concentration. The patient is not hyperactive.       Objective:   Physical Exam BP 132/62  Pulse 79  Temp 98 F (36.7 C) (Oral)  Ht 5\' 2"  (1.575 m)  Wt 122 lb 8 oz (55.566 kg)  BMI 22.41 kg/m2  SpO2 96% Physical Exam  VS noted, not ill appearing but fatigued, 2+ nervous Constitutional: Pt appears well-developed and well-nourished.  HENT: Head: Normocephalic.  Right Ear: External ear normal.  Left Ear: External ear normal.  Left tm's mild erythema.  Sinus nontender.  Pharynx mild erythema Eyes: Conjunctivae and EOM are normal. Pupils are equal, round, and reactive to light.  Neck: Normal range of motion. Neck supple.  Cardiovascular: Normal rate and regular rhythm.   Pulmonary/Chest: Effort normal and breath sounds normal.  Abd:  Soft, NT, non-distended, + BS Neurological: Pt is alert. No cranial nerve deficit. motor/sens/dtr/gait intact Skin: Skin is warm. No erythema. No rash Psychiatric Thought content normal.     Assessment & Plan:

## 2012-03-20 NOTE — Patient Instructions (Addendum)
Your EKG was OK today Take all new medications as prescribed - the dexilant if ok with your insurance, and the clonazepam as needed for nerves Your form for the surgury OK will be signed and faxed Continue all other medications as before I think you should seek help with taking care of your mother asap, or consider [facility] for her If the daytime sleepiness does not improve with getting better sleep, less stress, and more time after the recent infection (such as in 2 weeks) please call for referral to pulmonary for possible sleep apnea evaluation

## 2012-03-20 NOTE — Telephone Encounter (Signed)
Caller: Cheryl Mcguire/Patient; Patient Name: Cheryl Mcguire; PCP: Oliver Barre; Best Callback Phone Number: [phone].  Onset unknown.  "I just don't have any energy". Pt has lost her home and car. Her mother is ill. Pt losing weight.  Pt says she doesn't know if she can take any more and began to cry.  No plans to harm self or others.  All emergent symptoms per Depression protocol ruled out with excpetion to 'Recent loss through loss of home, relocation, financial issues'  See Provider in 72 hrs.  Appt scheduled 03/20/2012 @ 1615 with Dr. Jonny Ruiz.

## 2012-03-20 NOTE — Assessment & Plan Note (Addendum)
ECG reviewed as per emr, hx and exam today without new signficant physical problem, stress test 1 yr ago neg per pt - ok for surgury as planned, form to be signed

## 2012-03-21 ENCOUNTER — Encounter: Payer: Self-pay | Admitting: Internal Medicine

## 2012-03-21 DIAGNOSIS — R5383 Other fatigue: Secondary | ICD-10-CM | POA: Insufficient documentation

## 2012-03-21 DIAGNOSIS — K219 Gastro-esophageal reflux disease without esophagitis: Secondary | ICD-10-CM | POA: Insufficient documentation

## 2012-03-21 DIAGNOSIS — F419 Anxiety disorder, unspecified: Secondary | ICD-10-CM | POA: Insufficient documentation

## 2012-03-21 NOTE — Assessment & Plan Note (Signed)
protonix not working as well - gave rx and discount coupon for dexilant to try if not too expensive with her insurance

## 2012-03-21 NOTE — Assessment & Plan Note (Signed)
Rather severe situational, d/w pt her current social situation and counseled on how it is affecting her emotional and physcial wellbeing, and the degree to which she describes her mother's illness and impairments/incontinence issues would certainly qualify her for consideration for long term NHP such as [facility], and would certainly take the burden off her; ok for klonopin prn for hopefully short term tx, decline counseling, she may want to get with her mother's MD regarding other local resources such as PT or referral to [facility] for her mother

## 2012-03-21 NOTE — Assessment & Plan Note (Signed)
stable overall by hx and exam, most recent data reviewed with pt, and pt to continue medical treatment as before BP Readings from Last 3 Encounters:  03/20/12 132/62  03/12/12 120/62  03/05/12 147/71

## 2012-03-21 NOTE — Assessment & Plan Note (Addendum)
Rather marked, I suspect the confluence of mult factors including post infectious fatigue post recent URI,  Overwork taking care of her mother, lack of adequate sleep hygeine, increased stress, and ongoing depression;  .some improved in last couple of days subjectively - pt to call if not overall improved in 1-2 wks to consider referral for OSA  Note:  Time for pt hx, exam, review of record with pt in room, detemination of diagnosis and plan for further eval and tx is > 40 min

## 2012-03-21 NOTE — Assessment & Plan Note (Signed)
stable overall by hx and exam, most recent data reviewed with pt, and pt to continue medical treatment as before SpO2 Readings from Last 3 Encounters:  03/20/12 96%  03/12/12 98%  03/05/12 100%

## 2012-03-21 NOTE — Assessment & Plan Note (Signed)
stable overall by hx and exam, most recent data reviewed with pt, and pt to continue medical treatment as before Lab Results  Component Value Date   WBC 19.0* 02/11/2012   HGB 12.8 02/11/2012   HCT 38.8 02/11/2012   PLT 461* 02/11/2012   GLUCOSE 96 02/11/2012   CHOL 180 01/17/2012   TRIG 237.0* 01/17/2012   HDL 38.10* 01/17/2012   LDLDIRECT 116.9 01/17/2012   ALT 7 02/11/2012   AST 10 02/11/2012   NA 133* 02/11/2012   K 3.8 02/11/2012   CL 97 02/11/2012   CREATININE 1.30* 02/11/2012   BUN 25* 02/11/2012   CO2 23 02/11/2012   TSH 0.22* 01/17/2012

## 2012-03-23 ENCOUNTER — Telehealth: Payer: Self-pay

## 2012-03-23 NOTE — Telephone Encounter (Signed)
Faxed Surgical Clearance Form to [facility]. To [phone].  Patient is cleared for surgery per PCP.

## 2012-03-25 ENCOUNTER — Other Ambulatory Visit: Payer: Self-pay | Admitting: Specialist

## 2012-03-25 ENCOUNTER — Encounter: Payer: Self-pay | Admitting: Endocrinology

## 2012-03-25 ENCOUNTER — Ambulatory Visit (INDEPENDENT_AMBULATORY_CARE_PROVIDER_SITE_OTHER): Payer: PRIVATE HEALTH INSURANCE | Admitting: Endocrinology

## 2012-03-25 VITALS — BP 122/68 | HR 85 | Temp 97.6°F | Ht 62.0 in | Wt 122.0 lb

## 2012-03-25 DIAGNOSIS — E042 Nontoxic multinodular goiter: Secondary | ICD-10-CM

## 2012-03-25 NOTE — Patient Instructions (Addendum)

## 2012-03-25 NOTE — Progress Notes (Signed)
Subjective:    Patient ID: Cheryl Mcguire, female    DOB: 1945-10-08, 66 y.o.   MRN: [id number]  HPI Pt states few years of intermittent slight dizziness sensation in the head, and assoc fatigue Past Medical History  Diagnosis Date  . Genital warts   . Heart disease   . History of colon polyps   . Arthritis   . Vulva cancer 1975  . COPD (chronic obstructive pulmonary disease)   . Depression   . Emphysema of lung   . Hypertension   . Hyperlipidemia   . Multinodular goiter (nontoxic)   . 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 Ischemia, bowel   . Carotid artery stenosis     bilateral    Past Surgical History  Procedure Date  . Cholecystectomy   . Tubal ligation   . Appendectomy   . Carpal tunnel release   . Nose surgery   . Abdominal hysterectomy 1964    History   Social History  . Marital Status: Legally Separated    Spouse Name: N/A    Number of Children: 3  . Years of Education: GED   Occupational History  . Retired-Disability    Social History Main Topics  . Smoking status: Current Everyday Smoker -- 1.0 packs/day  . Smokeless tobacco: Never Used  . Alcohol Use: No  . Drug Use: No  . Sexually Active:    Other Topics Concern  . Not on file   Social History Narrative   Daily caffeine     Current Outpatient Prescriptions on File Prior to Visit  Medication Sig Dispense Refill  . albuterol (PROVENTIL HFA;VENTOLIN HFA) 108 (90 BASE) MCG/ACT inhaler Inhale 1-2 puffs into the lungs every 6 (six) hours as needed for wheezing.  1 Inhaler  0  . albuterol (PROVENTIL) (2.5 MG/3ML) 0.083% nebulizer solution Take 3 mLs (2.5 mg total) by nebulization every 6 (six) hours as needed. For shortness of breath  75 mL  11  . amLODipine (NORVASC) 10 MG tablet Take 10 mg by mouth at bedtime.      . clonazePAM (KLONOPIN) 0.5 MG tablet Take 1 tablet (0.5 mg total) by mouth 2 (two) times daily as needed for anxiety.  60 tablet  1  . dexlansoprazole (DEXILANT) 60 MG  capsule Take 1 capsule (60 mg total) by mouth daily.  90 capsule  3  . FLUoxetine (PROZAC) 40 MG capsule Take 40 mg by mouth at bedtime.      Marland Kitchen ibuprofen (ADVIL,MOTRIN) 200 MG tablet Take 400 mg by mouth every 6 (six) hours as needed. For pain      . Mometasone Furo-Formoterol Fum (DULERA) 200-5 MCG/ACT AERO Inhale 1 puff into the lungs 2 (two) times daily.  13 g  1  . pramipexole (MIRAPEX) 0.25 MG tablet Take 1 tablet (0.25 mg total) by mouth at bedtime.  90 tablet  3  . traMADol (ULTRAM) 50 MG tablet Take 1 tablet (50 mg total) by mouth every 6 (six) hours as needed. For pain  120 tablet  2  . traZODone (DESYREL) 50 MG tablet Take 50-100 mg by mouth at bedtime.      Marland Kitchen aspirin EC 81 MG tablet Take 81 mg by mouth at bedtime.      . clopidogrel (PLAVIX) 75 MG tablet Take 75 mg by mouth daily.      Marland Kitchen lisinopril (PRINIVIL,ZESTRIL) 20 MG tablet Take 20 mg by mouth at bedtime.  No Known Allergies  Family History  Problem Relation Age of Onset  . Alcohol abuse Other   . Arthritis Other   . Heart disease Other   . Stroke Other   . Kidney disease Other   . Diabetes Other   . Hypertension Other   . Arthritis Other   . Heart disease Other   . Mental illness Other   . Diabetes Other   . Hypertension Other   . Alcohol abuse Other   . Hyperlipidemia Other   . Heart disease Other   . Hypertension Other   . Mental illness Other   . Diabetes Other   . Colon cancer Neg Hx   . Colon polyps Sister   mother and sister had uncertain types of thyroid problems  BP 122/68  Pulse 85  Temp 97.6 F (36.4 C) (Oral)  Ht 5\' 2"  (1.575 m)  Wt 122 lb (55.339 kg)  BMI 22.31 kg/m2  SpO2 97%  Review of Systems denies headache, hoarseness, double vision, palpitations, diarrhea, polyuria, excessive diaphoresis, numbness, insomnia, and menopausal sxs.   She reports rhinorrhea, anxiety, tremor, doe, easy bruising, myalgias, and polydipsia.  She has lost a few lbs.     Objective:   Physical Exam VS:  see vs page GEN: no distress HEAD: head: no deformity eyes: no periorbital swelling, no proptosis external nose and ears are normal mouth: no lesion seen NECK: supple, thyroid is not enlarged CHEST WALL: no deformity LUNGS:  Clear to auscultation. CV: reg rate and rhythm, no murmur MUSCULOSKELETAL: muscle bulk and strength are grossly normal.  no obvious joint swelling.  gait is normal and steady EXTEMITIES: no deformity.  no edema PULSES: dorsalis pedis intact bilat.   NEURO:  cn 2-12 grossly intact.   readily moves all 4's.  sensation is intact to touch on the feet.  No tremor. SKIN:  Normal texture and temperature.  No rash or suspicious lesion is visible.  Not diaphoretic. NODES:  None palpable at the neck PSYCH: alert, oriented x3.  Does not appear anxious nor depressed.  (i reviewed [place] result) Lab Results  Component Value Date   TSH 0.22* 01/17/2012      Assessment & Plan:  Multinodular goiter, which is usually hereditary Hyperthyroidism, new, due to the goiter Weight loss, possible due to hyperthyroidism

## 2012-03-27 ENCOUNTER — Encounter (HOSPITAL_COMMUNITY): Payer: Self-pay | Admitting: Pharmacy Technician

## 2012-03-30 ENCOUNTER — Telehealth: Payer: Self-pay | Admitting: Internal Medicine

## 2012-03-30 NOTE — Telephone Encounter (Signed)
These bumps have nothing to do with the thyroid

## 2012-03-30 NOTE — Telephone Encounter (Signed)
Can pt still have thyroid scan or treatment?

## 2012-03-30 NOTE — Telephone Encounter (Signed)
Yes, no problem!

## 2012-03-30 NOTE — Telephone Encounter (Signed)
Pt informed of MD's advisement. 

## 2012-03-30 NOTE — Telephone Encounter (Signed)
Has bumps on gums and tongue, does she still have thyroid treatment?

## 2012-03-30 NOTE — Telephone Encounter (Signed)
Ok to defer to Dr Everardo All who has seen her last

## 2012-03-31 NOTE — Patient Instructions (Signed)
20 Cheryl Mcguire  03/31/2012   Your procedure is scheduled on:  04/09/12 1030am-1200noon  Report to [facility] at 0800 AM.  Call this number if you have problems the morning of surgery: [phone]   Remember:   Do not eat food:After Midnight.  May have clear liquids:until Midnight .  Marland Kitchen  Take these medicines the morning of surgery with A SIP OF WATER:    Do not wear jewelry, make-up or nail polish.  Do not wear lotions, powders, or perfumes. .  Do not shave 48 hours prior to surgery.  Do not bring valuables to the hospital.  Contacts, dentures or bridgework may not be worn into surgery.  Leave suitcase in the car. After surgery it may be brought to your room.  For patients admitted to the hospital, checkout time is 11:00 AM the day of discharge.       Special Instructions: CHG Shower Use Special Wash: 1/2 bottle night before surgery and 1/2 bottle morning of surgery. shower chin to toes with CHG.  Wash face and private parts with regular soap.    Please read over the following fact sheets that you were given: MRSA Information, Incentive Spirometry Fact Sheet, coughing and deep breathing exercises, leg exercises

## 2012-04-01 ENCOUNTER — Encounter (HOSPITAL_COMMUNITY): Payer: Self-pay

## 2012-04-01 ENCOUNTER — Telehealth: Payer: Self-pay | Admitting: Internal Medicine

## 2012-04-01 ENCOUNTER — Encounter (HOSPITAL_COMMUNITY)
Admission: RE | Admit: 2012-04-01 | Discharge: 2012-04-01 | Disposition: A | Discharge status: Home / Self Care | Payer: PRIVATE HEALTH INSURANCE | Source: Ambulatory Visit | Attending: Specialist | Admitting: Specialist

## 2012-04-01 ENCOUNTER — Encounter (HOSPITAL_COMMUNITY)
Admission: RE | Admit: 2012-04-01 | Discharge: 2012-04-01 | Disposition: A | Discharge status: Home / Self Care | Payer: PRIVATE HEALTH INSURANCE | Source: Ambulatory Visit | Attending: Endocrinology | Admitting: Endocrinology

## 2012-04-01 ENCOUNTER — Ambulatory Visit (HOSPITAL_COMMUNITY)
Admission: RE | Admit: 2012-04-01 | Discharge: 2012-04-01 | Disposition: A | Discharge status: Home / Self Care | Payer: PRIVATE HEALTH INSURANCE | Source: Ambulatory Visit | Attending: Specialist | Admitting: Specialist

## 2012-04-01 DIAGNOSIS — E042 Nontoxic multinodular goiter: Secondary | ICD-10-CM | POA: Insufficient documentation

## 2012-04-01 DIAGNOSIS — Z8673 Personal history of transient ischemic attack (TIA), and cerebral infarction without residual deficits: Secondary | ICD-10-CM | POA: Insufficient documentation

## 2012-04-01 DIAGNOSIS — I10 Essential (primary) hypertension: Secondary | ICD-10-CM | POA: Insufficient documentation

## 2012-04-01 DIAGNOSIS — E785 Hyperlipidemia, unspecified: Secondary | ICD-10-CM | POA: Insufficient documentation

## 2012-04-01 DIAGNOSIS — Z79899 Other long term (current) drug therapy: Secondary | ICD-10-CM | POA: Insufficient documentation

## 2012-04-01 DIAGNOSIS — Z01812 Encounter for preprocedural laboratory examination: Secondary | ICD-10-CM | POA: Insufficient documentation

## 2012-04-01 DIAGNOSIS — M19019 Primary osteoarthritis, unspecified shoulder: Secondary | ICD-10-CM | POA: Insufficient documentation

## 2012-04-01 DIAGNOSIS — J4489 Other specified chronic obstructive pulmonary disease: Secondary | ICD-10-CM | POA: Insufficient documentation

## 2012-04-01 DIAGNOSIS — G4710 Hypersomnia, unspecified: Secondary | ICD-10-CM

## 2012-04-01 DIAGNOSIS — J449 Chronic obstructive pulmonary disease, unspecified: Secondary | ICD-10-CM | POA: Insufficient documentation

## 2012-04-01 DIAGNOSIS — S43429A Sprain of unspecified rotator cuff capsule, initial encounter: Secondary | ICD-10-CM | POA: Insufficient documentation

## 2012-04-01 DIAGNOSIS — X58XXXA Exposure to other specified factors, initial encounter: Secondary | ICD-10-CM | POA: Insufficient documentation

## 2012-04-01 HISTORY — DX: Thyrotoxicosis, unspecified without thyrotoxic crisis or storm: E05.90

## 2012-04-01 HISTORY — DX: Shortness of breath: R06.02

## 2012-04-01 HISTORY — DX: Gastro-esophageal reflux disease without esophagitis: K21.9

## 2012-04-01 LAB — URINALYSIS, ROUTINE W REFLEX MICROSCOPIC
Glucose, UA: NEGATIVE mg/dL
Ketones, ur: NEGATIVE mg/dL
Nitrite: NEGATIVE
Specific Gravity, Urine: 1.014 (ref 1.005–1.030)
pH: 5.5 (ref 5.0–8.0)

## 2012-04-01 LAB — CBC
MCH: 28.9 pg (ref 26.0–34.0)
MCHC: 33.4 g/dL (ref 30.0–36.0)
MCV: 86.5 fL (ref 78.0–100.0)
Platelets: 375 10*3/uL (ref 150–400)
RBC: 4.36 MIL/uL (ref 3.87–5.11)
RDW: 14.2 % (ref 11.5–15.5)

## 2012-04-01 LAB — COMPREHENSIVE METABOLIC PANEL
ALT: 9 U/L (ref 0–35)
AST: 11 U/L (ref 0–37)
Albumin: 3.6 g/dL (ref 3.5–5.2)
CO2: 28 mEq/L (ref 19–32)
Calcium: 9.2 mg/dL (ref 8.4–10.5)
Creatinine, Ser: 1.15 mg/dL — ABNORMAL HIGH (ref 0.50–1.10)
Sodium: 140 mEq/L (ref 135–145)
Total Protein: 6.7 g/dL (ref 6.0–8.3)

## 2012-04-01 LAB — SURGICAL PCR SCREEN
MRSA, PCR: NEGATIVE
Staphylococcus aureus: NEGATIVE

## 2012-04-01 LAB — URINE MICROSCOPIC-ADD ON

## 2012-04-01 LAB — PROTIME-INR: INR: 0.84 (ref 0.00–1.49)

## 2012-04-01 NOTE — Progress Notes (Signed)
Dr Jonny Ruiz,       Patient was in for preop appointment today for right rotator cuff surgery on 04/09/12.  In reviewing with patient medical history and medications patient reported she has not taken Plavix in approximately 2 months or more.  Patient has history of stroke .  I reported to her surgeon- Dr Jene Every- today and thought you would like to be aware of this also.

## 2012-04-01 NOTE — Progress Notes (Signed)
Spoke with Cordelia Pen at office of Dr Jene Every regarding patient reported " I am taking a radioactive pill today for overactive thyroid per Dr Everardo All and have to come back on 04/02/12.  I am not to be around anyone for several days per doctor.".  Instructed patient to call office of Dr Isaias Cowman and let him be aware.  Patient also reported off Plavix x 2 months per patient.  Patient states she has not been instructed by any MD to be off Plavix.  Also reported to office of Dr Jene Every.   Faxed and confirmation received to Dr Shelle Iron regarding abnormal white count, abnormal urinalysis and micro results and Creatinine results.  Also abnormal CXR results.

## 2012-04-01 NOTE — Telephone Encounter (Signed)
Ok for referral - done per emr 

## 2012-04-01 NOTE — Progress Notes (Signed)
04/01/12 0903  OBSTRUCTIVE SLEEP APNEA  Have you ever been diagnosed with sleep apnea through a sleep study? No  Do you snore loudly (loud enough to be heard through closed doors)?  1  Do you often feel tired, fatigued, or sleepy during the daytime? 1  Has anyone observed you stop breathing during your sleep? 0  Do you have, or are you being treated for high blood pressure? 1  BMI more than 35 kg/m2? 0  Age over 66 years old? 1  Neck circumference greater than 40 cm/18 inches? 0  Gender: 0  Obstructive Sleep Apnea Score 4   Score 4 or greater  Updated health history

## 2012-04-02 ENCOUNTER — Other Ambulatory Visit: Payer: Self-pay | Admitting: Endocrinology

## 2012-04-02 ENCOUNTER — Encounter (HOSPITAL_COMMUNITY)
Admission: RE | Admit: 2012-04-02 | Discharge: 2012-04-02 | Disposition: A | Discharge status: Home / Self Care | Payer: PRIVATE HEALTH INSURANCE | Source: Ambulatory Visit | Attending: Endocrinology | Admitting: Endocrinology

## 2012-04-02 ENCOUNTER — Other Ambulatory Visit: Payer: Self-pay | Admitting: Specialist

## 2012-04-02 ENCOUNTER — Encounter: Payer: Self-pay | Admitting: Endocrinology

## 2012-04-02 DIAGNOSIS — E042 Nontoxic multinodular goiter: Secondary | ICD-10-CM

## 2012-04-02 DIAGNOSIS — M75120 Complete rotator cuff tear or rupture of unspecified shoulder, not specified as traumatic: Secondary | ICD-10-CM

## 2012-04-02 MED ORDER — SODIUM IODIDE I 131 CAPSULE
7.1000 | Freq: Once | INTRAVENOUS | Status: AC | PRN
  Administered 2012-04-02: 7.1 via ORAL

## 2012-04-02 MED ORDER — SODIUM PERTECHNETATE TC 99M INJECTION
10.5000 | Freq: Once | INTRAVENOUS | Status: AC | PRN
  Administered 2012-04-02: 11 via INTRAVENOUS

## 2012-04-02 NOTE — Progress Notes (Signed)
Patient was made aware to use [facility] Inhaler am of surgery and to bring Inhaler with her the am of surgery.  Patient voiced understanding.

## 2012-04-07 ENCOUNTER — Telehealth: Payer: Self-pay | Admitting: Internal Medicine

## 2012-04-07 ENCOUNTER — Ambulatory Visit
Admission: RE | Admit: 2012-04-07 | Discharge: 2012-04-07 | Disposition: A | Discharge status: Home / Self Care | Payer: PRIVATE HEALTH INSURANCE | Source: Ambulatory Visit | Attending: Specialist | Admitting: Specialist

## 2012-04-07 DIAGNOSIS — M75120 Complete rotator cuff tear or rupture of unspecified shoulder, not specified as traumatic: Secondary | ICD-10-CM

## 2012-04-07 MED ORDER — CLOPIDOGREL BISULFATE 75 MG PO TABS: 75.0000 mg | ORAL_TABLET | Freq: Every day | ORAL | Status: DC

## 2012-04-07 MED ORDER — FLUCONAZOLE 150 MG PO TABS: ORAL_TABLET | ORAL | Status: AC

## 2012-04-07 NOTE — Telephone Encounter (Signed)
Ok for plavix - will send refill;  Remind to restart AFTER the shoulder surgury (not today) (no blood work normally needed when taking this medication)  Ok for diflucan  - done erx

## 2012-04-07 NOTE — Telephone Encounter (Signed)
Called the patient left message to call back 

## 2012-04-08 NOTE — Telephone Encounter (Signed)
Called the patient informed of instructions on medication and rx sent in for yeast.

## 2012-04-09 ENCOUNTER — Observation Stay (HOSPITAL_COMMUNITY)
Admission: RE | Admit: 2012-04-09 | Discharge: 2012-04-11 | Disposition: A | Discharge status: Home / Self Care | Payer: PRIVATE HEALTH INSURANCE | Source: Ambulatory Visit | Attending: Specialist | Admitting: Specialist

## 2012-04-09 ENCOUNTER — Encounter (HOSPITAL_COMMUNITY): Payer: Self-pay | Admitting: Anesthesiology

## 2012-04-09 ENCOUNTER — Encounter (HOSPITAL_COMMUNITY)
Admission: RE | Disposition: A | Discharge status: Home / Self Care | Payer: Self-pay | Source: Ambulatory Visit | Attending: Specialist

## 2012-04-09 ENCOUNTER — Ambulatory Visit (HOSPITAL_COMMUNITY): Payer: PRIVATE HEALTH INSURANCE | Admitting: Anesthesiology

## 2012-04-09 ENCOUNTER — Encounter (HOSPITAL_COMMUNITY): Payer: Self-pay | Admitting: *Deleted

## 2012-04-09 DIAGNOSIS — M67919 Unspecified disorder of synovium and tendon, unspecified shoulder: Principal | ICD-10-CM | POA: Insufficient documentation

## 2012-04-09 DIAGNOSIS — J438 Other emphysema: Secondary | ICD-10-CM | POA: Insufficient documentation

## 2012-04-09 DIAGNOSIS — I69998 Other sequelae following unspecified cerebrovascular disease: Secondary | ICD-10-CM | POA: Insufficient documentation

## 2012-04-09 DIAGNOSIS — K219 Gastro-esophageal reflux disease without esophagitis: Secondary | ICD-10-CM | POA: Insufficient documentation

## 2012-04-09 DIAGNOSIS — R29898 Other symptoms and signs involving the musculoskeletal system: Secondary | ICD-10-CM | POA: Insufficient documentation

## 2012-04-09 DIAGNOSIS — I10 Essential (primary) hypertension: Secondary | ICD-10-CM | POA: Insufficient documentation

## 2012-04-09 DIAGNOSIS — F17200 Nicotine dependence, unspecified, uncomplicated: Secondary | ICD-10-CM | POA: Insufficient documentation

## 2012-04-09 DIAGNOSIS — M75101 Unspecified rotator cuff tear or rupture of right shoulder, not specified as traumatic: Secondary | ICD-10-CM

## 2012-04-09 DIAGNOSIS — E785 Hyperlipidemia, unspecified: Secondary | ICD-10-CM | POA: Insufficient documentation

## 2012-04-09 DIAGNOSIS — M19019 Primary osteoarthritis, unspecified shoulder: Secondary | ICD-10-CM | POA: Insufficient documentation

## 2012-04-09 DIAGNOSIS — M719 Bursopathy, unspecified: Principal | ICD-10-CM | POA: Insufficient documentation

## 2012-04-09 HISTORY — PX: SHOULDER OPEN ROTATOR CUFF REPAIR: SHX2407

## 2012-04-09 SURGERY — REPAIR, ROTATOR CUFF, OPEN
Anesthesia: General | Laterality: Right | Wound class: Clean

## 2012-04-09 MED ORDER — CEFAZOLIN SODIUM 1-5 GM-% IV SOLN
1.0000 g | Freq: Four times a day (QID) | INTRAVENOUS | Status: AC
  Administered 2012-04-09 – 2012-04-10 (×3): 1 g via INTRAVENOUS
  Filled 2012-04-09 (×3): qty 50

## 2012-04-09 MED ORDER — SODIUM CHLORIDE 0.45 % IV SOLN
INTRAVENOUS | Status: DC
  Administered 2012-04-09: 18:00:00 via INTRAVENOUS

## 2012-04-09 MED ORDER — ONDANSETRON HCL 4 MG/2ML IJ SOLN
INTRAMUSCULAR | Status: DC | PRN
  Administered 2012-04-09: 4 mg via INTRAVENOUS

## 2012-04-09 MED ORDER — FENTANYL CITRATE 0.05 MG/ML IJ SOLN
50.0000 ug | INTRAMUSCULAR | Status: DC | PRN
  Administered 2012-04-09: 50 ug via INTRAVENOUS

## 2012-04-09 MED ORDER — CHLORHEXIDINE GLUCONATE 4 % EX LIQD: 60.0000 mL | Freq: Once | CUTANEOUS | Status: DC

## 2012-04-09 MED ORDER — MOMETASONE FURO-FORMOTEROL FUM 200-5 MCG/ACT IN AERO
1.0000 | INHALATION_SPRAY | Freq: Two times a day (BID) | RESPIRATORY_TRACT | Status: DC
  Filled 2012-04-09 (×17): qty 0.2

## 2012-04-09 MED ORDER — HYDROMORPHONE HCL PF 1 MG/ML IJ SOLN
0.2500 mg | INTRAMUSCULAR | Status: DC | PRN
  Administered 2012-04-09 (×2): 0.5 mg via INTRAVENOUS

## 2012-04-09 MED ORDER — PRAMIPEXOLE DIHYDROCHLORIDE 0.25 MG PO TABS
0.2500 mg | ORAL_TABLET | Freq: Every evening | ORAL | Status: DC
  Administered 2012-04-09 – 2012-04-10 (×2): 0.25 mg via ORAL
  Filled 2012-04-09 (×3): qty 1

## 2012-04-09 MED ORDER — METOCLOPRAMIDE HCL 5 MG/ML IJ SOLN: 5.0000 mg | Freq: Three times a day (TID) | INTRAMUSCULAR | Status: DC | PRN

## 2012-04-09 MED ORDER — HYDROMORPHONE HCL PF 1 MG/ML IJ SOLN: 0.5000 mg | INTRAMUSCULAR | Status: DC | PRN

## 2012-04-09 MED ORDER — PROMETHAZINE HCL 25 MG/ML IJ SOLN: 6.2500 mg | INTRAMUSCULAR | Status: DC | PRN

## 2012-04-09 MED ORDER — METHOCARBAMOL 100 MG/ML IJ SOLN
500.0000 mg | Freq: Four times a day (QID) | INTRAVENOUS | Status: DC | PRN
  Administered 2012-04-09: 500 mg via INTRAVENOUS
  Filled 2012-04-09: qty 5

## 2012-04-09 MED ORDER — MENTHOL 3 MG MT LOZG
1.0000 | LOZENGE | OROMUCOSAL | Status: DC | PRN
  Administered 2012-04-10: 3 mg via ORAL
  Filled 2012-04-09 (×2): qty 9

## 2012-04-09 MED ORDER — CLONAZEPAM 0.5 MG PO TABS
0.5000 mg | ORAL_TABLET | Freq: Every day | ORAL | Status: DC
  Administered 2012-04-09 – 2012-04-11 (×3): 0.5 mg via ORAL
  Filled 2012-04-09 (×3): qty 1

## 2012-04-09 MED ORDER — OXYCODONE HCL 5 MG PO TABS: ORAL_TABLET | ORAL | Status: AC

## 2012-04-09 MED ORDER — PANTOPRAZOLE SODIUM 40 MG PO TBEC
40.0000 mg | DELAYED_RELEASE_TABLET | Freq: Every day | ORAL | Status: DC
  Administered 2012-04-09 – 2012-04-11 (×2): 40 mg via ORAL
  Filled 2012-04-09 (×3): qty 1

## 2012-04-09 MED ORDER — PROPOFOL 10 MG/ML IV BOLUS
INTRAVENOUS | Status: DC | PRN
  Administered 2012-04-09: 140 mg via INTRAVENOUS

## 2012-04-09 MED ORDER — LACTATED RINGERS IV SOLN
INTRAVENOUS | Status: DC
  Administered 2012-04-09: 1000 mL via INTRAVENOUS

## 2012-04-09 MED ORDER — ENOXAPARIN SODIUM 30 MG/0.3ML ~~LOC~~ SOLN
30.0000 mg | SUBCUTANEOUS | Status: DC
  Administered 2012-04-10 – 2012-04-11 (×2): 30 mg via SUBCUTANEOUS
  Filled 2012-04-09 (×3): qty 0.3

## 2012-04-09 MED ORDER — BUPIVACAINE-EPINEPHRINE 0.5% -1:200000 IJ SOLN
INTRAMUSCULAR | Status: DC | PRN
  Administered 2012-04-09: 19 mL

## 2012-04-09 MED ORDER — DOCUSATE SODIUM 100 MG PO CAPS
100.0000 mg | ORAL_CAPSULE | Freq: Two times a day (BID) | ORAL | Status: DC
  Administered 2012-04-09 – 2012-04-11 (×4): 100 mg via ORAL

## 2012-04-09 MED ORDER — SODIUM CHLORIDE 0.9 % IR SOLN
Status: DC | PRN
  Administered 2012-04-09: 11:00:00

## 2012-04-09 MED ORDER — FENTANYL CITRATE 0.05 MG/ML IJ SOLN
INTRAMUSCULAR | Status: DC | PRN
  Administered 2012-04-09: 50 ug via INTRAVENOUS
  Administered 2012-04-09: 25 ug via INTRAVENOUS

## 2012-04-09 MED ORDER — TRAZODONE HCL 50 MG PO TABS
50.0000 mg | ORAL_TABLET | Freq: Every day | ORAL | Status: DC
  Administered 2012-04-09 – 2012-04-10 (×2): 50 mg via ORAL
  Filled 2012-04-09 (×4): qty 2

## 2012-04-09 MED ORDER — PHENOL 1.4 % MT LIQD: 1.0000 | OROMUCOSAL | Status: DC | PRN

## 2012-04-09 MED ORDER — ACETAMINOPHEN 650 MG RE SUPP: 650.0000 mg | Freq: Four times a day (QID) | RECTAL | Status: DC | PRN

## 2012-04-09 MED ORDER — ONDANSETRON HCL 4 MG/2ML IJ SOLN: 4.0000 mg | Freq: Four times a day (QID) | INTRAMUSCULAR | Status: DC | PRN

## 2012-04-09 MED ORDER — METOCLOPRAMIDE HCL 10 MG PO TABS: 5.0000 mg | ORAL_TABLET | Freq: Three times a day (TID) | ORAL | Status: DC | PRN

## 2012-04-09 MED ORDER — CEFAZOLIN SODIUM-DEXTROSE 2-3 GM-% IV SOLR
INTRAVENOUS | Status: AC
  Filled 2012-04-09: qty 50

## 2012-04-09 MED ORDER — LISINOPRIL 20 MG PO TABS
20.0000 mg | ORAL_TABLET | Freq: Every day | ORAL | Status: DC
  Administered 2012-04-09 – 2012-04-10 (×2): 20 mg via ORAL
  Filled 2012-04-09 (×4): qty 1

## 2012-04-09 MED ORDER — ONDANSETRON HCL 4 MG PO TABS: 4.0000 mg | ORAL_TABLET | Freq: Four times a day (QID) | ORAL | Status: DC | PRN

## 2012-04-09 MED ORDER — ACETAMINOPHEN 325 MG PO TABS: 650.0000 mg | ORAL_TABLET | Freq: Four times a day (QID) | ORAL | Status: DC | PRN

## 2012-04-09 MED ORDER — CEFAZOLIN SODIUM-DEXTROSE 2-3 GM-% IV SOLR
2.0000 g | INTRAVENOUS | Status: AC
  Administered 2012-04-09: 2 g via INTRAVENOUS

## 2012-04-09 MED ORDER — FLUOXETINE HCL 20 MG PO CAPS
40.0000 mg | ORAL_CAPSULE | Freq: Every day | ORAL | Status: DC
  Administered 2012-04-09 – 2012-04-10 (×2): 40 mg via ORAL
  Filled 2012-04-09 (×4): qty 2

## 2012-04-09 MED ORDER — MOMETASONE FURO-FORMOTEROL FUM 200-5 MCG/ACT IN AERO
1.0000 | INHALATION_SPRAY | Freq: Two times a day (BID) | RESPIRATORY_TRACT | Status: DC
  Filled 2012-04-09 (×17): qty 13

## 2012-04-09 MED ORDER — BUPIVACAINE-EPINEPHRINE (PF) 0.5% -1:200000 IJ SOLN
INTRAMUSCULAR | Status: AC
  Filled 2012-04-09: qty 10

## 2012-04-09 MED ORDER — AMLODIPINE BESYLATE 10 MG PO TABS
10.0000 mg | ORAL_TABLET | Freq: Every day | ORAL | Status: DC
  Administered 2012-04-09 – 2012-04-10 (×2): 10 mg via ORAL
  Filled 2012-04-09 (×4): qty 1

## 2012-04-09 MED ORDER — FENTANYL CITRATE 0.05 MG/ML IJ SOLN
INTRAMUSCULAR | Status: AC
  Filled 2012-04-09: qty 2

## 2012-04-09 MED ORDER — OXYCODONE HCL 5 MG PO TABS
5.0000 mg | ORAL_TABLET | ORAL | Status: DC | PRN
  Administered 2012-04-09 – 2012-04-11 (×11): 5 mg via ORAL
  Filled 2012-04-09 (×11): qty 1

## 2012-04-09 MED ORDER — HYDROMORPHONE HCL PF 1 MG/ML IJ SOLN
INTRAMUSCULAR | Status: AC
  Filled 2012-04-09: qty 1

## 2012-04-09 MED ORDER — METHOCARBAMOL 500 MG PO TABS: 500.0000 mg | ORAL_TABLET | Freq: Three times a day (TID) | ORAL | Status: AC | PRN

## 2012-04-09 MED ORDER — MOMETASONE FURO-FORMOTEROL FUM 200-5 MCG/ACT IN AERO
1.0000 | INHALATION_SPRAY | Freq: Two times a day (BID) | RESPIRATORY_TRACT | Status: DC
  Administered 2012-04-10 (×2): 1 via RESPIRATORY_TRACT
  Filled 2012-04-09: qty 13

## 2012-04-09 MED ORDER — METHOCARBAMOL 500 MG PO TABS
500.0000 mg | ORAL_TABLET | Freq: Four times a day (QID) | ORAL | Status: DC | PRN
  Administered 2012-04-09 – 2012-04-11 (×6): 500 mg via ORAL
  Filled 2012-04-09 (×6): qty 1

## 2012-04-09 SURGICAL SUPPLY — 46 items
ANCHOR ROTATOR CUFF #2 (Anchor) ×4 IMPLANT
BAG ZIPLOCK 12X15 (MISCELLANEOUS) ×2 IMPLANT
BENZOIN TINCTURE PRP APPL 2/3 (GAUZE/BANDAGES/DRESSINGS) ×2 IMPLANT
BLADE OSCILLATING/SAGITTAL (BLADE) ×1
BLADE SW THK.38XMED LNG THN (BLADE) ×1 IMPLANT
BNDG COHESIVE 4X5 WHT NS (GAUZE/BANDAGES/DRESSINGS) ×2 IMPLANT
BUR OVAL CARBIDE 4.0 (BURR) ×2 IMPLANT
CHLORAPREP W/TINT 26ML (MISCELLANEOUS) IMPLANT
CLEANER TIP ELECTROSURG 2X2 (MISCELLANEOUS) ×2 IMPLANT
CLOTH BEACON ORANGE TIMEOUT ST (SAFETY) ×2 IMPLANT
DECANTER SPIKE VIAL GLASS SM (MISCELLANEOUS) ×2 IMPLANT
DRAPE ORTHO SPLIT 77X108 STRL (DRAPES) ×1
DRAPE POUCH INSTRU U-SHP 10X18 (DRAPES) ×2 IMPLANT
DRAPE SURG ORHT 6 SPLT 77X108 (DRAPES) ×1 IMPLANT
DRSG EMULSION OIL 3X3 NADH (GAUZE/BANDAGES/DRESSINGS) ×2 IMPLANT
DRSG TELFA 4X5 ISLAND ADH (GAUZE/BANDAGES/DRESSINGS) ×2 IMPLANT
DURAPREP 26ML APPLICATOR (WOUND CARE) IMPLANT
ELECT NEEDLE TIP 2.8 STRL (NEEDLE) ×2 IMPLANT
ELECT REM PT RETURN 9FT ADLT (ELECTROSURGICAL) ×2
ELECTRODE REM PT RTRN 9FT ADLT (ELECTROSURGICAL) ×1 IMPLANT
GLOVE BIOGEL PI IND STRL 8 (GLOVE) ×1 IMPLANT
GLOVE BIOGEL PI INDICATOR 8 (GLOVE) ×1
GLOVE ECLIPSE 6.5 STRL STRAW (GLOVE) ×2 IMPLANT
GLOVE INDICATOR 6.5 STRL GRN (GLOVE) ×2 IMPLANT
GLOVE SURG SS PI 8.0 STRL IVOR (GLOVE) ×4 IMPLANT
GOWN PREVENTION PLUS LG XLONG (DISPOSABLE) ×2 IMPLANT
GOWN STRL REIN XL XLG (GOWN DISPOSABLE) ×2 IMPLANT
KIT BASIN OR (CUSTOM PROCEDURE TRAY) ×2 IMPLANT
MANIFOLD NEPTUNE II (INSTRUMENTS) ×2 IMPLANT
NEEDLE MA TROC 1/2 (NEEDLE) IMPLANT
NEEDLE MA TROC 1/2 CIR (NEEDLE) IMPLANT
PACK SHOULDER CUSTOM OPM052 (CUSTOM PROCEDURE TRAY) ×2 IMPLANT
POSITIONER SURGICAL ARM (MISCELLANEOUS) ×2 IMPLANT
PUSHLOCK PEEK 4.5X24 (Orthopedic Implant) ×2 IMPLANT
SLING ARM IMMOBILIZER LRG (SOFTGOODS) IMPLANT
SLING ARM IMMOBILIZER MED (SOFTGOODS) ×2 IMPLANT
SLING ULTRA II S (ORTHOPEDIC SUPPLIES) IMPLANT
SPONGE LAP 4X18 X RAY DECT (DISPOSABLE) ×2 IMPLANT
STRIP CLOSURE SKIN 1/2X4 (GAUZE/BANDAGES/DRESSINGS) ×2 IMPLANT
SUT BONE WAX W31G (SUTURE) ×2 IMPLANT
SUT ETHIBOND 0 (SUTURE) ×4 IMPLANT
SUT ETHIBOND 2 OS 4 DA (SUTURE) ×2 IMPLANT
SUT PROLENE 3 0 PS 2 (SUTURE) ×4 IMPLANT
SUT VIC AB 1-0 CT2 27 (SUTURE) ×4 IMPLANT
SUT VIC AB 2-0 CT2 27 (SUTURE) ×4 IMPLANT
SUT VICRYL 0 UR6 27IN ABS (SUTURE) IMPLANT

## 2012-04-09 NOTE — Transfer of Care (Signed)
Immediate Anesthesia Transfer of Care Note  Patient: Cheryl Mcguire  Procedure(s) Performed: Procedure(s) (LRB) with comments: ROTATOR CUFF REPAIR SHOULDER OPEN (Right) - Right Open Mini Rotator Cuff Repair, Distal Clavicle Resection    Patient Location: PACU  Anesthesia Type: General  Level of Consciousness: awake, alert , oriented and patient cooperative  Airway & Oxygen Therapy: Patient Spontanous Breathing and Patient connected to face mask oxygen  Post-op Assessment: Report given to PACU RN and Post -op Vital signs reviewed and stable  Post vital signs: Reviewed and stable  Complications: No apparent anesthesia complications

## 2012-04-09 NOTE — Preoperative (Signed)
Beta Blockers   Reason not to administer Beta Blockers:Not Applicable 

## 2012-04-09 NOTE — Anesthesia Postprocedure Evaluation (Signed)
  Anesthesia Post-op Note  Patient: Cheryl Mcguire  Procedure(s) Performed: Procedure(s) (LRB): ROTATOR CUFF REPAIR SHOULDER OPEN (Right)  Patient Location: PACU  Anesthesia Type: General  Level of Consciousness: awake and alert   Airway and Oxygen Therapy: Patient Spontanous Breathing  Post-op Pain: mild  Post-op Assessment: Post-op Vital signs reviewed, Patient's Cardiovascular Status Stable, Respiratory Function Stable, Patent Airway and No signs of Nausea or vomiting  Post-op Vital Signs: stable  Complications: No apparent anesthesia complications

## 2012-04-09 NOTE — Brief Op Note (Signed)
04/09/2012  11:28 AM  PATIENT:  Robbie Louis  66 y.o. female  PRE-OPERATIVE DIAGNOSIS:  rotator cuff tear on right,  ac arthrosis  POST-OPERATIVE DIAGNOSIS:  rotator cuff tear on right,  ac arthrosis  PROCEDURE:  Procedure(s) (LRB) with comments: ROTATOR CUFF REPAIR SHOULDER OPEN (Right) - Right Open Mini Rotator Cuff Repair, Distal Clavicle Resection    SURGEON:  Surgeon(s) and Role:    * Javier Docker, MD - Primary  PHYSICIAN ASSISTANT:   ASSISTANTS: first assist   ANESTHESIA:   general  EBL:     BLOOD ADMINISTERED:none  DRAINS: none   LOCAL MEDICATIONS USED:  MARCAINE     SPECIMEN:  No Specimen  DISPOSITION OF SPECIMEN:  N/A  COUNTS:  YES  TOURNIQUET:  * No tourniquets in log *  DICTATION: .Other Dictation: Dictation Number [id number]  PLAN OF CARE: Admit for overnight observation  PATIENT DISPOSITION:  PACU - hemodynamically stable.   Delay start of Pharmacological VTE agent (>24hrs) due to surgical blood loss or risk of bleeding: no

## 2012-04-09 NOTE — H&P (Signed)
Cheryl Mcguire is an 66 y.o. female.   Chief Complaint: right RCT pain HPI: Right RC tear AC arthrosis.  Past Medical History  Diagnosis Date  . Genital warts   . Heart disease   . History of colon polyps   . Arthritis   . Vulva cancer 1975  . COPD (chronic obstructive pulmonary disease)   . Depression   . Emphysema of lung   . Hypertension   . Hyperlipidemia   . Multinodular goiter (nontoxic)   . 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 Ischemia, bowel   . Carotid artery stenosis     bilateral  . Hyperthyroidism     overactive thyroid   . Shortness of breath     with exertion   . GERD (gastroesophageal reflux disease)     Past Surgical History  Procedure Date  . Cholecystectomy   . Tubal ligation   . Appendectomy   . Carpal tunnel release   . Nose surgery   . Abdominal hysterectomy 1964    Family History  Problem Relation Age of Onset  . Alcohol abuse Other   . Arthritis Other   . Heart disease Other   . Stroke Other   . Kidney disease Other   . Diabetes Other   . Hypertension Other   . Arthritis Other   . Heart disease Other   . Mental illness Other   . Diabetes Other   . Hypertension Other   . Alcohol abuse Other   . Hyperlipidemia Other   . Heart disease Other   . Hypertension Other   . Mental illness Other   . Diabetes Other   . Colon cancer Neg Hx   . Colon polyps Sister    Social History:  reports that she has been smoking.  She has never used smokeless tobacco. She reports that she drinks alcohol. She reports that she does not use illicit drugs.  Allergies: No Known Allergies  No prescriptions prior to admission    No results found for this or any previous visit (from the past 48 hour(s)). Ct Chest Wo Contrast  04/07/2012  *RADIOLOGY REPORT*  Clinical Data: evaluate right apical density.  CT CHEST WITHOUT CONTRAST  Technique:  Multidetector CT imaging of the chest was performed following the standard protocol without IV contrast.   Comparison: None  Findings: No axillary adenopathy.  No supraclavicular adenopathy. No mediastinal or hilar adenopathy.  No pericardial or pleural effusion.  Advanced changes of emphysema. There are no suspicious pulmonary nodules or masses identified.  Review of the visualized osseous structures is unremarkable.  Limited imaging through the upper abdomen shows changes of prior cholecystectomy.  Both adrenal glands are normal.  IMPRESSION:  1.  No active cardiopulmonary abnormalities.  No nodule or mass noted. 2.  Emphysema.   Original Report Authenticated By: Rosealee Albee, M.D.     Review of Systems  Musculoskeletal: Positive for joint pain.  All other systems reviewed and are negative.    There were no vitals taken for this visit. Physical Exam  Vitals reviewed. Constitutional: She appears well-developed.  HENT:  Head: Normocephalic.  Eyes: Pupils are equal, round, and reactive to light.  Neck: Normal range of motion.  Cardiovascular: Normal rate.   Respiratory: Effort normal.  GI: Soft.  Musculoskeletal:       Positive impingement right. Tender AC right. NVI  Neurological: She is alert.  Skin: Skin is warm and dry.  Psychiatric: She  has a normal mood and affect.   MRI tear RC and AC arthrosis right.  Assessment/Plan Right RCT Sx AC arthrosis. Plan RCR DCR.  Sadae Arrazola C 04/09/2012, 6:56 AM

## 2012-04-09 NOTE — Anesthesia Preprocedure Evaluation (Addendum)
Anesthesia Evaluation  Patient identified by MRN, date of birth, ID band Patient awake    Reviewed: Allergy & Precautions, H&P , NPO status , Patient's Chart, lab work & pertinent test results  Airway Mallampati: II TM Distance: >3 FB Neck ROM: Full    Dental  (+) Edentulous Upper   Pulmonary shortness of breath and at rest, COPD COPD inhaler, Current Smoker,  breath sounds clear to auscultation Significant COPD. Took Dulera at [employer] today. Slight wheeze right lung. Pulmonary exam normal + wheezing      Cardiovascular hypertension, Pt. on medications Rhythm:Regular Rate:Normal     Neuro/Psych PSYCHIATRIC DISORDERS Anxiety Depression Stroke 2010 , with some residual weakness left side, leg greater than arm. TIACVA negative psych ROS   GI/Hepatic Neg liver ROS, GERD-  Medicated,  Endo/Other  Hyperthyroidism   Renal/GU negative Renal ROS  negative genitourinary   Musculoskeletal negative musculoskeletal ROS (+)   Abdominal   Peds negative pediatric ROS (+)  Hematology negative hematology ROS (+)   Anesthesia Other Findings   Reproductive/Obstetrics negative OB ROS                          Anesthesia Physical Anesthesia Plan  ASA: III  Anesthesia Plan: General   Post-op Pain Management:    Induction: Intravenous  Airway Management Planned: Oral ETT  Additional Equipment:   Intra-op Plan:   Post-operative Plan: Extubation in OR  Informed Consent: I have reviewed the patients History and Physical, chart, labs and discussed the procedure including the risks, benefits and alternatives for the proposed anesthesia with the patient or authorized representative who has indicated his/her understanding and acceptance.   Dental advisory given  Plan Discussed with: CRNA  Anesthesia Plan Comments: (Discussed r/b/a interscalene block. Plan just general.)        Anesthesia Quick  Evaluation

## 2012-04-10 ENCOUNTER — Encounter (HOSPITAL_COMMUNITY): Payer: Self-pay | Admitting: Specialist

## 2012-04-10 DIAGNOSIS — M75101 Unspecified rotator cuff tear or rupture of right shoulder, not specified as traumatic: Secondary | ICD-10-CM

## 2012-04-10 LAB — BASIC METABOLIC PANEL
BUN: 10 mg/dL (ref 6–23)
Calcium: 8.5 mg/dL (ref 8.4–10.5)
GFR calc Af Amer: 66 mL/min — ABNORMAL LOW (ref >90)
GFR calc non Af Amer: 57 mL/min — ABNORMAL LOW (ref >90)
Potassium: 4.2 mEq/L (ref 3.5–5.1)
Sodium: 135 mEq/L (ref 135–145)

## 2012-04-10 MED ORDER — FLUCONAZOLE 150 MG PO TABS: ORAL_TABLET | ORAL | Status: AC

## 2012-04-10 MED ORDER — METHOCARBAMOL 500 MG PO TABS: 500.0000 mg | ORAL_TABLET | Freq: Four times a day (QID) | ORAL | Status: AC | PRN

## 2012-04-10 MED ORDER — OXYCODONE HCL 5 MG PO TABS: 5.0000 mg | ORAL_TABLET | ORAL | Status: AC | PRN

## 2012-04-10 NOTE — Progress Notes (Signed)
Subjective: 1 Day Post-Op Procedure(s) (LRB): ROTATOR CUFF REPAIR SHOULDER OPEN (Right) Patient reports pain as 4 on 0-10 scale.    Objective: Vital signs in last 24 hours: Temp:  [97.6 F (36.4 Mcguire)-98.5 F (36.9 Mcguire)] 98.4 F (36.9 Mcguire) (09/06 0519) Pulse Rate:  [68-102] 74  (09/06 0519) Resp:  [13-26] 16  (09/06 0519) BP: (106-151)/(55-76) 132/70 mmHg (09/06 0519) SpO2:  [95 %-100 %] 95 % (09/06 0519) FiO2 (%):  [99 %] 99 % (09/05 1300) Weight:  [55.611 kg (122 lb 9.6 oz)] 55.611 kg (122 lb 9.6 oz) (09/05 1300)  Intake/Output from previous day: 09/05 0701 - 09/06 0700 In: 1638.3 [P.O.:360; I.V.:1278.3] Out: 600 [Urine:600] Intake/Output this shift:    No results found for this basename: HGB:5 in the last 72 hours No results found for this basename: WBC:2,RBC:2,HCT:2,PLT:2 in the last 72 hours No results found for this basename: NA:2,K:2,CL:2,CO2:2,BUN:2,CREATININE:2,GLUCOSE:2,CALCIUM:2 in the last 72 hours No results found for this basename: LABPT:2,INR:2 in the last 72 hours  Neurologically intact Neurovascular intact Sensation intact distally Incision: scant drainage No cellulitis present Compartment soft  Assessment/Plan: 1 Day Post-Op Procedure(s) (LRB): ROTATOR CUFF REPAIR SHOULDER OPEN (Right) Discharge home with home health Dc instructions given  Cheryl Mcguire 04/10/2012, 7:06 AM

## 2012-04-10 NOTE — Evaluation (Signed)
Occupational Therapy Evaluation Patient Details Name: Cheryl Mcguire MRN: [id number] DOB: 12-Apr-1946 Today's Date: 04/10/2012 Time: 9563-8756 OT Time Calculation (min): 26 min  OT Assessment / Plan / Recommendation Clinical Impression  Pt is a 66 yo female who presents s/p R rotator cuff repair. All education completed. Do not feel pt's ex husband will be able to assist very much. Very hesistant to receive education. Pt verbalized understanding with good return demo and handout given. Pt extremely unsteady on feet. Recommend a PT consult prior to d/c.    OT Assessment  Progress rehab of shoulder as ordered by MD at follow-up appointment    Follow Up Recommendations  No OT follow up    Barriers to Discharge      Equipment Recommendations  None recommended by OT    Recommendations for Other Services PT consult  Frequency       Precautions / Restrictions Precautions Precautions: Shoulder Type of Shoulder Precautions: Order for ADL and sling ed only Precaution Booklet Issued: Yes (comment)   Pertinent Vitals/Pain Reports 10/10 pain in R shoulder. Repositioned for comfort and cold applied.    ADL  Toilet Transfer: Performed;Min Pension scheme manager Method: Sit to Barista: Regular height toilet Toileting - Clothing Manipulation and Hygiene: Performed;Min guard Transfers/Ambulation Related to ADLs: Pt extremely unsteady ambulating to the bathroom without an AD. Pt typically uses SPC in R hand due to old CVA    OT Diagnosis:    OT Problem List:   OT Treatment Interventions:     OT Goals    Visit Information  Last OT Received On: 04/10/12 Assistance Needed: +1    Subjective Data  Subjective: I feel a little wobbly. Patient Stated Goal: Be able to use my arm again.   Prior Functioning  Vision/Perception  Home Living Lives With: Other (Comment) (ex-husbands home) Available Help at Discharge: Family Type of Home: Mobile home Home Access:  Stairs to enter Entrance Stairs-Number of Steps: 3 Entrance Stairs-Rails: Right;Left Home Layout: One level Bathroom Shower/Tub: Engineer, manufacturing systems: Standard Home Adaptive Equipment: Straight cane Prior Function Level of Independence: Independent Able to Take Stairs?: Yes Driving: Yes Vocation: Retired Musician: No difficulties Dominant Hand: Right      Cognition  Overall Cognitive Status: Appears within functional limits for tasks assessed/performed Arousal/Alertness: Awake/alert Orientation Level: Appears intact for tasks assessed Behavior During Session: [facility] for tasks performed    Extremity/Trunk Assessment Right Upper Extremity Assessment RUE ROM/Strength/Tone: Unable to fully assess;Due to pain;Due to precautions Left Upper Extremity Assessment LUE ROM/Strength/Tone: [facility] for tasks assessed   Mobility  Shoulder Instructions  Bed Mobility Bed Mobility: Supine to Sit Supine to Sit: 4: Min assist;HOB flat;With rails Details for Bed Mobility Assistance: Min VCs for hand placement and technique. Transfers Transfers: Sit to Stand;Stand to Sit Sit to Stand: 4: Min guard;With upper extremity assist;From bed;From toilet Stand to Sit: 4: Min guard;With upper extremity assist;To toilet;To bed Details for Transfer Assistance: Pt very unsteady. Unable to use her cane in R hand due to sling.   Donning/doffing shirt without moving shoulder: Set-up Method for sponge bathing under operated UE: Caregiver independent with task Donning/doffing sling/immobilizer: Patient able to independently direct caregiver Correct positioning of sling/immobilizer: Patient able to independently direct caregiver;Modified independent ROM for elbow, wrist and digits of operated UE: Modified independent Sling wearing schedule (on at all times/off for ADL's): Modified independent Proper positioning of operated UE when showering: Modified independent Dressing change: Patient  able to independently direct  caregiver Positioning of UE while sleeping: Modified independent   Exercise     Balance     End of Session OT - End of Session Activity Tolerance: Patient limited by pain Patient left: in bed;with call bell/phone within reach;with family/visitor present  GO Functional Assessment Tool Used: Clinical Judgement Functional Limitation: Self care Self Care Current Status (W0981): At least 20 percent but less than 40 percent impaired, limited or restricted Self Care Goal Status (X9147): At least 1 percent but less than 20 percent impaired, limited or restricted Self Care Discharge Status [phone]): At least 1 percent but less than 20 percent impaired, limited or restricted   Jp Eastham A OTR/L [phone] 04/10/2012, 11:18 AM

## 2012-04-10 NOTE — Evaluation (Signed)
Physical Therapy Evaluation Patient Details Name: Cheryl Mcguire MRN: [id number] DOB: [birth year] Today's Date: 04/10/2012 Time: 1200-1228 PT Time Calculation (min): 28 min  PT Assessment / Plan / Recommendation Clinical Impression  66 yo female s/p R RCR. OT evaluated and recommended PT eval. On eval pt very unsteady with ambulation. Demonstrates high fall risk . Do not feel pt is safe to d/c home today based on performance. Notified RN. PT will likely require 24 hour supervision/assist at home initially. Pt states she has people at home who can assist if needed. Will see pt for 2nd tx this pm.     PT Assessment  Patient needs continued PT services    Follow Up Recommendations  Supervision/Assistance - 24 hour;Home health PT    Barriers to Discharge        Equipment Recommendations   (assessing for need for quad cane)    Recommendations for Other Services OT consult   Frequency Min 3X/week    Precautions / Restrictions Precautions Precautions: Shoulder Type of Shoulder Precautions: NWB. R shoulder sling Precaution Booklet Issued: Yes (comment) Restrictions Weight Bearing Restrictions: Yes RUE Weight Bearing: Non weight bearing   Pertinent Vitals/Pain       Mobility  Bed Mobility Bed Mobility: Supine to Sit Supine to Sit: HOB elevated;With rails;4: Min guard Details for Bed Mobility Assistance: Increased time and heavy reliance on rail. Discussed bed mobility technique at home-recommend pt roll onto L side and use forearm to push up to sitting.  Transfers Transfers: Sit to Stand;Stand to Sit Sit to Stand: 3: Mod assist;With upper extremity assist;From bed Stand to Sit: 4: Min assist;With armrests;To chair/3-in-1 Details for Transfer Assistance: VCs safety, technique, hand placement. Assist to rise, stabilize, control descent.  Ambulation/Gait Ambulation/Gait Assistance: 3: Mod assist Ambulation Distance (Feet): 75 Feet Assistive device: Straight cane Ambulation/Gait  Assistance Details: VCs safety, technique. Pt very unsteady. L knee buckling intermittently. External assist required throughout ambulation to prevent fall.  Gait Pattern: Step-through pattern;Decreased stride length;Decreased step length - right;Decreased step length - left    Exercises     PT Diagnosis: Difficulty walking;Abnormality of gait;Acute pain  PT Problem List: Decreased mobility;Pain;Decreased balance;Decreased activity tolerance;Decreased knowledge of use of DME;Decreased strength PT Treatment Interventions: DME instruction;Gait training;Stair training;Functional mobility training;Therapeutic activities;Therapeutic exercise;Balance training;Patient/family education   PT Goals Acute Rehab PT Goals PT Goal Formulation: With patient Time For Goal Achievement: 04/17/12 Potential to Achieve Goals: Good Pt will go Supine/Side to Sit: with supervision PT Goal: Supine/Side to Sit - Progress: Goal set today Pt will go Sit to Stand: with supervision PT Goal: Sit to Stand - Progress: Goal set today Pt will Transfer Bed to Chair/Chair to Bed: with supervision PT Transfer Goal: Bed to Chair/Chair to Bed - Progress: Goal set today Pt will Ambulate: 51 - 150 feet;with supervision;with least restrictive assistive device PT Goal: Ambulate - Progress: Goal set today Pt will Go Up / Down Stairs: 3-5 stairs;with min assist;with rail(s) PT Goal: Up/Down Stairs - Progress: Goal set today  Visit Information  Last PT Received On: 04/10/12 Assistance Needed: +1    Subjective Data  Subjective: "This shoulder is really hurting" Patient Stated Goal: Home   Prior Functioning  Home Living Lives With: Other (Comment) (ex-husbands home) Available Help at Discharge: Family Type of Home: Mobile home Home Access: Stairs to enter Entrance Stairs-Number of Steps: 3 Entrance Stairs-Rails: Right;Left Home Layout: One level Bathroom Shower/Tub: Engineer, manufacturing systems: Standard Home Adaptive  Equipment: Straight cane Prior Function Level  of Independence: Independent Able to Take Stairs?: Yes Driving: Yes Vocation: Retired Musician: No difficulties Dominant Hand: Right    Cognition  Overall Cognitive Status: Appears within functional limits for tasks assessed/performed Arousal/Alertness: Awake/alert Orientation Level: Appears intact for tasks assessed Behavior During Session: [facility] for tasks performed    Extremity/Trunk Assessment Right Upper Extremity Assessment RUE ROM/Strength/Tone: Unable to fully assess;Due to pain;Due to precautions Left Upper Extremity Assessment LUE ROM/Strength/Tone: [facility] for tasks assessed Right Lower Extremity Assessment RLE ROM/Strength/Tone: [facility] for tasks assessed Left Lower Extremity Assessment LLE ROM/Strength/Tone: Deficits LLE ROM/Strength/Tone Deficits: Noted L knee buckling during ambulation. Strength at least 3/5 with functional activity   Balance Balance Balance Assessed: Yes Dynamic Standing Balance Dynamic Standing - Balance Support: Left upper extremity supported Dynamic Standing - Level of Assistance: 3: Mod assist  End of Session PT - End of Session Equipment Utilized During Treatment: Gait belt Activity Tolerance: Patient tolerated treatment well Patient left: in chair;with call bell/phone within reach  GP Functional Assessment Tool Used: Clinical judgement Functional Limitation: Mobility: Walking and moving around Mobility: Walking and Moving Around Current Status (Y7829): At least 40 percent but less than 60 percent impaired, limited or restricted Mobility: Walking and Moving Around Goal Status [phone]): At least 1 percent but less than 20 percent impaired, limited or restricted   Rebeca Alert [facility] 04/10/2012, 1:38 PM [phone]

## 2012-04-10 NOTE — Care Management Note (Unsigned)
    Page 1 of 2   04/10/2012     5:53:53 PM   CARE MANAGEMENT NOTE 04/10/2012  Patient:  Cheryl Mcguire, Cheryl Mcguire   Account Number:  [id number]  Date Initiated:  04/10/2012  Documentation initiated by:  Colleen Can  Subjective/Objective Assessment:   dx  rotator cuff repair     Action/Plan:   CM spoke with patient. Plans are for her to return to her home in [place] where ex husband and stepdaughter will be caregivers   Anticipated DC Date:  04/11/2012   Anticipated DC Plan:  HOME W HOME HEALTH SERVICES  In-house referral  NA      DC Planning Services  CM consult      [facility] Choice  HOME HEALTH  DURABLE MEDICAL EQUIPMENT   Choice offered to / List presented to:  C-1 Patient   DME arranged  [facility]      DME agency  [facility]     HH arranged  HH-2 PT      [facility] agency  [facility]   Status of service:  In process, will continue to follow Medicare Important Message given?   (If response is "NO", the following Medicare IM given date fields will be blank) Date Medicare IM given:   Date Additional Medicare IM given:    Discharge Disposition:    Per UR Regulation:  Reviewed for med. necessity/level of care/duration of stay  If discussed at Long Length of Stay Meetings, dates discussed:    Comments:  04/10/2012 Raynelle Bring BSN CCM [phone] [facility] can provide HHPT services with start date of Monday 04/13/2012 [facility] notified of need for quad cane. Face sheet, op note, H&P, PT note, and HH pt order faxed to [facility]  [phone]

## 2012-04-10 NOTE — Op Note (Signed)
NAMEBRENDAN, Cheryl Mcguire NO.:  [id number]  MEDICAL RECORD NO.:  [id number]  LOCATION:  1606                         FACILITY:  [facility]  PHYSICIAN:  Jene Every, M.D.    DATE OF BIRTH:  03-Jun-1946  DATE OF PROCEDURE:  04/09/2012 DATE OF DISCHARGE:                              OPERATIVE REPORT   PREOPERATIVE DIAGNOSES:  Rotator cuff tear, acromioclavicular arthrosis, massive rotator cuff tear, right shoulder.  POSTOPERATIVE DIAGNOSES:  Rotator cuff tear, acromioclavicular arthrosis, massive rotator cuff tear, right shoulder.  PROCEDURE PERFORMED: 1. Exam under anesthesia. 2. Mini-open rotator cuff repair. 3. Distal clavicle resection. 4. Utilization of Mitek suture anchors, subacromial decompression,     acromioplasty.  ANESTHESIA:  General.  ASSISTANT:  First Assist, surgical assist.  BRIEF HISTORY:  A 66 year old, retracted tear of the rotator cuff, AC arthrosis, indicated for repair and distal clavicle resection.  Risks and benefits discussed including bleeding, infection, inability to repair, DVT, PE, anesthetic complications, adhesive capsulitis.  The patient was a smoker, was advised for smoking cessation, indicated deleterious side effects.  TECHNIQUE:  With the patient in supine beach-chair position, after induction of adequate general anesthesia, 2 g Kefzol, the right shoulder and upper extremity was prepped and draped in usual sterile fashion. Just prior to that, we ranged her.  She actually had a near full range of motion, slight decrease in forward flexion.  Surgical marker utilized to delineate the acromion and AC joint.  I made incision over the anterolateral aspect of the acromion extending over to the [facility] joint. Subcutaneous tissue was dissected by electrocautery to achieve hemostasis.  First, the deltotrapezial fascia was split.  The capsule over the [facility] joint was incised, skeletonized with the distal clavicle, severe arthrosis noted the [facility]  joint.  This was debrided using oscillating saw to remove 1 cm of the distal clavicle.  Protected the soft tissues anteriorly, posteriorly, and the rotator cuff.  Undercut it with 3 mm Kerrison, irrigated, placed bone wax on the distal clavicle. Then, closed the capsule with 0 Vicryl interrupted figure-of-eight sutures.  The deltotrapezial fascia with 2-0 Vicryl simple sutures.  We extended the raphae and the anterolateral aspect of the acromion to the anterolateral heads of the deltoid, subperiosteally elevated from the attachment off the anterolateral aspect of the acromion, placed Charnley retractor.  Detached the CA ligament.  Removed the small spur off the anterolateral aspect of the acromion, 3 mm Kerrison.  Irrigated the joint, massive tear retracted was noted.  There was very little retrieval infraspinatus noted.  There was some supraspinatus in multilayer tearing and attenuation.  Some of the supraspinatus subscap remaining anteriorly was advanced anterolaterally after adhesions were lysed with the Kerrison to the [facility] joint and the glenohumeral joint with a McCullough retractor.  After this, I prepared the bed in the greater tuberosity, lateral to the articular surface, placed 2 Mitek suture anchors there and advanced the tendon to the trough threaded up to the tendon was advanced into the bed and tied a good surgical knot and placed a PushLock over the anterolateral aspect of the humerus.  Tapped the awl and then placed a PushLock in a double row fashion for the repair  and then removed the redundant suture.  This was done with the anterior leaflet of the Mitek only.  Again posteriorly, we inserted together side-to-side what was attenuated and available in terms of the subscap and infraspinatus with 0 Vicryl interrupted figure-of-eight sutures.  The inferior portion of the infraspinatus had been missing and untreatable.  Copiously irrigated the wound.  Repaired raphae with  1 Vicryl interrupted figure-of-eight sutures, subcu with 2-0 Vicryl simple sutures.  Skin was reapproximated with 4-0 subcuticular Prolene.  Wound reinforced with Steri-Strips.  Sterile dressing applied.  Placed supine on hospital bed, extubated without difficulty.  After an abduction pillow, placed in a sling and transferred to recovery in satisfactory condition.  The patient tolerated the procedure well.  No complications.  Minimal blood loss.     Jene Every, M.D.     Cordelia Pen  D:  04/09/2012  T:  04/10/2012  Job:  [id number]

## 2012-04-10 NOTE — Progress Notes (Signed)
Physical Therapy Treatment Patient Details Name: Cheryl Mcguire MRN: [id number] DOB: November 30, 1945 Today's Date: 04/10/2012 Time: 1440-1500 PT Time Calculation (min): 20 min  PT Assessment / Plan / Recommendation Comments on Treatment Session  Some improvement with use of quad cane, but pt still unsteady. Will still need 24 hour supervision for mobility/ADLs. Will practice steps on tomorrow.     Follow Up Recommendations  Home health PT;Supervision/Assistance - 24 hour (Home Health OT)    Barriers to Discharge        Equipment Recommendations  Small-based quad cane;3 in 1 bedside comode    Recommendations for Other Services OT consult  Frequency Min 3X/week   Plan Discharge plan remains appropriate    Precautions / Restrictions Precautions Precautions: Shoulder Type of Shoulder Precautions: NWB R. Shoulder sling Restrictions Weight Bearing Restrictions: Yes RUE Weight Bearing: Non weight bearing   Pertinent Vitals/Pain     Mobility  Bed Mobility Bed Mobility: Sit to Supine Supine to Sit: 4: Min assist Details for Bed Mobility Assistance: Assist for LEs onto bed and final positioning. VCs for sit>sidelying then roll onto back.  Transfers Transfers: Sit to Stand;Stand to Sit Sit to Stand: 4: Min assist;With upper extremity assist;With armrests;From bed;From toilet Stand to Sit: 4: Min assist;To toilet;To bed Details for Transfer Assistance: VCs safety, technique, hand placement. Assist to rise, stabilize, control descent Ambulation/Gait Ambulation/Gait Assistance: 4: Min assist Ambulation Distance (Feet): 30 Feet Assistive device: Large base quad cane Ambulation/Gait Assistance Details: VCs safety, technique, distance from cane. Pt still unsteady with intermittent knee buckling. Continues to require assistance for balance/stability.  Gait Pattern: Step-to pattern;Decreased stride length;Decreased step length - right;Decreased step length - left    Exercises General  Exercises - Lower Extremity Long Arc Quad: AROM;Strengthening;Left;10 reps;Seated   PT Diagnosis: Difficulty walking;Abnormality of gait;Acute pain  PT Problem List: Decreased mobility;Pain;Decreased balance;Decreased activity tolerance;Decreased knowledge of use of DME;Decreased strength PT Treatment Interventions: DME instruction;Gait training;Stair training;Functional mobility training;Therapeutic activities;Therapeutic exercise;Balance training;Patient/family education   PT Goals Acute Rehab PT Goals PT Goal Formulation: With patient Time For Goal Achievement: 04/17/12 Potential to Achieve Goals: Good Pt will go Supine/Side to Sit: with supervision PT Goal: Supine/Side to Sit - Progress: Goal set today Pt will go Sit to Stand: with supervision PT Goal: Sit to Stand - Progress: Progressing toward goal Pt will Transfer Bed to Chair/Chair to Bed: with supervision PT Transfer Goal: Bed to Chair/Chair to Bed - Progress: Goal set today Pt will Ambulate: 51 - 150 feet;with supervision;with least restrictive assistive device PT Goal: Ambulate - Progress: Progressing toward goal Pt will Go Up / Down Stairs: 3-5 stairs;with min assist;with rail(s) PT Goal: Up/Down Stairs - Progress: Goal set today  Visit Information  Last PT Received On: 04/10/12 Assistance Needed: +1    Subjective Data  Subjective: "I think that went a little better than last time" Patient Stated Goal: Home   Cognition  Overall Cognitive Status: Appears within functional limits for tasks assessed/performed Arousal/Alertness: Awake/alert Orientation Level: Appears intact for tasks assessed Behavior During Session: [facility] for tasks performed    Balance  Balance Balance Assessed: Yes Dynamic Standing Balance Dynamic Standing - Balance Support: Left upper extremity supported Dynamic Standing - Level of Assistance: 3: Mod assist  End of Session PT - End of Session Equipment Utilized During Treatment: Gait  belt Activity Tolerance: Patient tolerated treatment well Patient left: in bed;with call bell/phone within reach   GP Functional Assessment Tool Used: Clinical judgement Functional Limitation: Mobility: Walking and  moving around Mobility: Walking and Moving Around Current Status [phone]): At least 40 percent but less than 60 percent impaired, limited or restricted Mobility: Walking and Moving Around Goal Status [phone]): At least 1 percent but less than 20 percent impaired, limited or restricted   Rebeca Alert [facility] 04/10/2012, 3:22 PM [phone]

## 2012-04-11 NOTE — Progress Notes (Signed)
D/c instructions given and explained to pt.  Prescriptions for oxycodone and robaxin given to pt.  Dressing changed and instructed pt on how and when to change dressing.  Pt alert and vs stable.  Home health set up with [facility] and pt has quad cane.  Pt ready for d/c.  Pt waiting on ride at this time.

## 2012-04-11 NOTE — Progress Notes (Signed)
Physical Therapy Treatment Patient Details Name: Cheryl Mcguire MRN: [id number] DOB: 1945-10-04 Today's Date: 04/11/2012 Time: 5956-3875 PT Time Calculation (min): 12 min  PT Assessment / Plan / Recommendation Comments on Treatment Session  Pt continues to require Min A for safe mobility due to impaired balance. Recommended pt have someone assist her at all times with mobility for safety, especially until [facility] comes out. continue to recommend HHPT and 24 hour supervision/assist due to impaired balance/high fall risk. Pt plans to d/c home today.     Follow Up Recommendations  Home health PT;Supervision/Assistance - 24 hour    Barriers to Discharge        Equipment Recommendations  Small-based quad cane;3 in 1 bedside comode    Recommendations for Other Services OT consult  Frequency Min 3X/week   Plan Discharge plan remains appropriate    Precautions / Restrictions Precautions Precautions: Shoulder Type of Shoulder Precautions: NWB R. Shoulder sling Restrictions Weight Bearing Restrictions: Yes RUE Weight Bearing: Non weight bearing   Pertinent Vitals/Pain 8/10 R shoulder    Mobility  Bed Mobility Bed Mobility: Supine to Sit;Sit to Supine Supine to Sit: 6: Modified independent (Device/Increase time);HOB elevated;With rails Sit to Supine: 6: Modified independent (Device/Increase time);HOB elevated;With rail Details for Bed Mobility Assistance: Increased time. Pain with mobility.  Transfers Transfers: Sit to Stand;Stand to Sit Sit to Stand: 4: Min assist;With upper extremity assist;From bed Stand to Sit: 4: Min assist;With upper extremity assist;To bed Details for Transfer Assistance: VCs safety, technique, hand placement. Assist to rise, stabilize, control descent.  Ambulation/Gait Ambulation/Gait Assistance: 4: Min assist Ambulation Distance (Feet): 50 Feet Assistive device: Large base quad cane Ambulation/Gait Assistance Details: VCs safety, technique, sequence, pacing,  distance from cane. LOB x 2 during ambultion-required Min A to prevent fall. Fatigues easily.  Gait Pattern: Step-to pattern;Decreased stride length;Decreased step length - right;Decreased step length - left Stairs: Yes Stairs Assistance: 4: Min assist Stair Management Technique: One rail Right;Sideways Number of Stairs: 5  (up and over portable steps x 2.)    Exercises     PT Diagnosis:    PT Problem List:   PT Treatment Interventions:     PT Goals Acute Rehab PT Goals Pt will go Supine/Side to Sit: with supervision PT Goal: Supine/Side to Sit - Progress: Progressing toward goal Pt will go Sit to Stand: with supervision PT Goal: Sit to Stand - Progress: Progressing toward goal Pt will Ambulate: 51 - 150 feet;with supervision;with least restrictive assistive device PT Goal: Ambulate - Progress: Progressing toward goal Pt will Go Up / Down Stairs: 3-5 stairs;with min assist;with rail(s) PT Goal: Up/Down Stairs - Progress: Met  Visit Information  Last PT Received On: 04/11/12 Assistance Needed: +1    Subjective Data  Subjective: "I think I'm better today. I'm ready to get out of here" Patient Stated Goal: Home ASAP   Cognition  Overall Cognitive Status: Appears within functional limits for tasks assessed/performed Arousal/Alertness: Awake/alert Orientation Level: Appears intact for tasks assessed Behavior During Session: [facility] for tasks performed    Balance     End of Session PT - End of Session Equipment Utilized During Treatment: Gait belt Activity Tolerance: Patient limited by fatigue;Patient limited by pain Patient left: in bed;with call bell/phone within reach   GP     Rebeca Alert [facility] 04/11/2012, 9:21 AM [phone]

## 2012-04-11 NOTE — Progress Notes (Signed)
Orthopedics Progress Note  Subjective: Pt c/o moderate soreness to right shoulder today otherwise doing well Plan for d/c today  Objective:  Filed Vitals:   04/11/12 0523  BP: 143/78  Pulse: 77  Temp: 98.4 F (36.9 C)  Resp: 17    General: Awake and alert  Musculoskeletal: right shoulder dressing and sling intact, nv intact distally Neurovascularly intact  Lab Results  Component Value Date   WBC 12.0* 04/01/2012   HGB 12.6 04/01/2012   HCT 37.7 04/01/2012   MCV 86.5 04/01/2012   PLT 375 04/01/2012       Component Value Date/Time   NA 135 04/10/2012 0805   K 4.2 04/10/2012 0805   CL 100 04/10/2012 0805   CO2 27 04/10/2012 0805   GLUCOSE 111* 04/10/2012 0805   BUN 10 04/10/2012 0805   CREATININE 1.01 04/10/2012 0805   CALCIUM 8.5 04/10/2012 0805   GFRNONAA 57* 04/10/2012 0805   GFRAA 66* 04/10/2012 0805    Lab Results  Component Value Date   INR 0.84 04/01/2012    Assessment/Plan: POD #1 s/p Procedure(s):right ROTATOR CUFF REPAIR SHOULDER OPEN  Plan to d/c home today F/u in 2 weeks  Almedia Balls. Ranell Patrick, MD 04/11/2012 7:33 AM

## 2012-04-13 NOTE — Discharge Summary (Signed)
Patient ID: Cheryl Mcguire MRN: [id number] DOB/AGE: November 29, 1945 66 y.o.  Admit date: 04/09/2012 Discharge date: 04/13/2012  Admission Diagnoses:  Principal Problem:  *Rotator cuff tear, right   Discharge Diagnoses:  Same  Past Medical History  Diagnosis Date  . Genital warts   . Heart disease   . History of colon polyps   . Arthritis   . Vulva cancer 1975  . COPD (chronic obstructive pulmonary disease)   . Depression   . Emphysema of lung   . Hypertension   . Hyperlipidemia   . Multinodular goiter (nontoxic)   . 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 Ischemia, bowel   . Carotid artery stenosis     bilateral  . Hyperthyroidism     overactive thyroid   . Shortness of breath     with exertion   . GERD (gastroesophageal reflux disease)     Surgeries: Procedure(s): ROTATOR CUFF REPAIR SHOULDER OPEN on 04/09/2012   Consultants:  none  Discharged Condition: Improved  Hospital Course: Cheryl Mcguire is an 66 y.o. female who was admitted 04/09/2012 for operative treatment ofRotator cuff tear, right. Patient has severe unremitting pain that affects sleep, daily activities, and work/hobbies. After pre-op clearance the patient was taken to the operating room on 04/09/2012 and underwent  Procedure(s): ROTATOR CUFF REPAIR SHOULDER OPEN.    Patient was given perioperative antibiotics: Anti-infectives     Start     Dose/Rate Route Frequency Ordered Stop   04/10/12 0000   fluconazole (DIFLUCAN) 150 MG tablet           04/10/12 0712 04/11/12 2359   04/09/12 1600   ceFAZolin (ANCEF) IVPB 1 g/50 mL premix        1 g 100 mL/hr over 30 Minutes Intravenous Every 6 hours 04/09/12 1331 04/10/12 0543   04/09/12 1031   polymyxin B 500,000 Units, bacitracin 50,000 Units in sodium chloride irrigation 0.9 % 500 mL irrigation  Status:  Discontinued          As needed 04/09/12 1031 04/09/12 1136   04/09/12 0813   ceFAZolin (ANCEF) IVPB 2 g/50 mL premix        2 g 100 mL/hr over 30  Minutes Intravenous 60 min pre-op 04/09/12 0813 04/09/12 1023           Patient was given sequential compression devices, early ambulation, and chemoprophylaxis to prevent DVT.  Patient benefited maximally from hospital stay and there were no complications.    Recent vital signs: No data found.    Recent laboratory studies:  Basename 04/10/12 0805  WBC --  HGB --  HCT --  PLT --  NA 135  K 4.2  CL 100  CO2 27  BUN 10  CREATININE 1.01  GLUCOSE 111*  INR --  CALCIUM 8.5     Discharge Medications:   Medication List  As of 04/13/2012  7:36 AM   STOP taking these medications         fluconazole 150 MG tablet      ibuprofen 200 MG tablet         TAKE these medications         amLODipine 10 MG tablet   Commonly known as: NORVASC   Take 10 mg by mouth at bedtime.      clonazePAM 0.5 MG tablet   Commonly known as: KLONOPIN   Take 0.5 mg by mouth daily.      clopidogrel 75 MG  tablet   Commonly known as: PLAVIX   Take 1 tablet (75 mg total) by mouth daily.      dexlansoprazole 60 MG capsule   Commonly known as: DEXILANT   Take 60 mg by mouth every evening.      FLUoxetine 40 MG capsule   Commonly known as: PROZAC   Take 40 mg by mouth at bedtime.      lisinopril 20 MG tablet   Commonly known as: PRINIVIL,ZESTRIL   Take 20 mg by mouth at bedtime.      methocarbamol 500 MG tablet   Commonly known as: ROBAXIN   Take 1 tablet (500 mg total) by mouth 3 (three) times daily between meals as needed.      methocarbamol 500 MG tablet   Commonly known as: ROBAXIN   Take 1 tablet (500 mg total) by mouth every 6 (six) hours as needed.      Mometasone Furo-Formoterol Fum 200-5 MCG/ACT Aero   Inhale 1 puff into the lungs 2 (two) times daily.      oxyCODONE 5 MG immediate release tablet   Commonly known as: Oxy IR/ROXICODONE   1-2 po q4-6 prn pain      oxyCODONE 5 MG immediate release tablet   Commonly known as: Oxy IR/ROXICODONE   Take 1-2 tablets (5-10 mg  total) by mouth every 3 (three) hours as needed.      pramipexole 0.25 MG tablet   Commonly known as: MIRAPEX   Take 0.25 mg by mouth every evening.      traMADol 50 MG tablet   Commonly known as: ULTRAM   Take 50 mg by mouth every 6 (six) hours as needed. Pain      traZODone 50 MG tablet   Commonly known as: DESYREL   Take 50-100 mg by mouth at bedtime.            Diagnostic Studies: Dg Chest 2 View  04/01/2012  *RADIOLOGY REPORT*  Clinical Data: Preop.  CHEST - 2 VIEW  Comparison: None.  Findings: Trachea is midline.  Heart size normal.  There is minimal asymmetry at the apices, with a small area of added density on the right, projecting over the right first rib.  Lungs are hyperinflated but otherwise clear.  No pleural fluid.  IMPRESSION: Mild asymmetric added density at the apex of the right hemithorax. In this patient who is a heavy smoker, CT chest without contrast is recommended in further evaluation, as clinically indicated. These results will be called to the ordering clinician or representative by the Radiologist Assistant, and communication documented in the PACS Dashboard.   Original Report Authenticated By: Reyes Ivan, M.D.    Ct Chest Wo Contrast  04/07/2012  *RADIOLOGY REPORT*  Clinical Data: evaluate right apical density.  CT CHEST WITHOUT CONTRAST  Technique:  Multidetector CT imaging of the chest was performed following the standard protocol without IV contrast.  Comparison: None  Findings: No axillary adenopathy.  No supraclavicular adenopathy. No mediastinal or hilar adenopathy.  No pericardial or pleural effusion.  Advanced changes of emphysema. There are no suspicious pulmonary nodules or masses identified.  Review of the visualized osseous structures is unremarkable.  Limited imaging through the upper abdomen shows changes of prior cholecystectomy.  Both adrenal glands are normal.  IMPRESSION:  1.  No active cardiopulmonary abnormalities.  No nodule or mass noted.  2.  Emphysema.   Original Report Authenticated By: Rosealee Albee, M.D.    Nm Thyroid Sng Uptake W/imaging  04/02/2012  *RADIOLOGY REPORT*  Clinical Data:  Suppressed TSH  THYROID SCAN AND UPTAKE - 24 HOURS  Technique:  Following the per oral administration of I-131 sodium iodide, the patient returned at 24 hours and uptake measurements were acquired with the uptake probe centered on the neck.  Thyroid imaging was performed following the intravenous administration of the Tc-68m Pertechnetate.  Radiopharmaceuticals:  721 uCi I-131 Sodium Iodide and 10.5 mCi TC- 23m Pertechnetate  Comparison:  Ultrasound 01/21/2012  Findings: There is mild heterogeneity of uptake within thyroid gland.  No discrete cold nodules are present.  24 hour I 131 uptake = 21.4 % (normal 10-30%)  IMPRESSION: Multinodular goiter with normal  uptake.   Original Report Authenticated By: Genevive Bi, M.D.     Disposition: 01-Home or Self Care  Discharge Orders    Future Appointments: Provider: Department: Dept Phone: Center:   04/15/2012 9:00 AM Beverley Fiedler, MD [facility] [phone] [facility]   04/17/2012 1:30 PM [facility] [phone] [place]   04/29/2012 3:30 PM Barbaraann Share, MD [facility] [phone] None   07/17/2012 10:00 AM [name], MD [facility] [phone] [facility]     Future Orders Please Complete By Expires   Diet - low sodium heart healthy      Diet - low sodium heart healthy      Call MD / Call 911      Comments:   If you experience chest pain or shortness of breath, CALL 911 and be transported to the hospital emergency room.  If you develope a fever above 101 F, pus (white drainage) or increased drainage or redness at the wound, or calf pain, call your surgeon's office.   Constipation Prevention      Comments:   Drink plenty of fluids.  Prune juice may be helpful.  You may use a stool softener, such as Colace (over the counter) 100 mg twice a day.  Use  MiraLax (over the counter) for constipation as needed.   Increase activity slowly as tolerated      Call MD / Call 911      Comments:   If you experience chest pain or shortness of breath, CALL 911 and be transported to the hospital emergency room.  If you develope a fever above 101 F, pus (white drainage) or increased drainage or redness at the wound, or calf pain, call your surgeon's office.   Constipation Prevention      Comments:   Drink plenty of fluids.  Prune juice may be helpful.  You may use a stool softener, such as Colace (over the counter) 100 mg twice a day.  Use MiraLax (over the counter) for constipation as needed.   Increase activity slowly as tolerated         Follow-up Information    Follow up with Edwards Mckelvie C, MD in 2 weeks.   Contact information:   [facility] [address] [place] [address] [phone]           Signed: Javier Docker 04/13/2012, 7:36 AM

## 2012-04-14 ENCOUNTER — Ambulatory Visit (HOSPITAL_COMMUNITY): Payer: PRIVATE HEALTH INSURANCE

## 2012-04-14 ENCOUNTER — Telehealth: Payer: Self-pay | Admitting: *Deleted

## 2012-04-14 ENCOUNTER — Encounter: Payer: Self-pay | Admitting: Internal Medicine

## 2012-04-14 NOTE — Telephone Encounter (Signed)
Pt informed of MD's advisement, given # to reschedule treatment ([phone]).

## 2012-04-14 NOTE — Telephone Encounter (Signed)
Pt called and wants to know if she needs to reschedule her I-131 tx that is scheduled for this Friday because she recently had surgery and cannot drive herself to the treatment. She was told by MD that she cannot be around other people after treatment, so she wants to know if she needs to reschedule.

## 2012-04-14 NOTE — Telephone Encounter (Signed)
Yes, please call x-ray to reschedule

## 2012-04-15 ENCOUNTER — Ambulatory Visit (INDEPENDENT_AMBULATORY_CARE_PROVIDER_SITE_OTHER): Payer: PRIVATE HEALTH INSURANCE | Admitting: Internal Medicine

## 2012-04-15 ENCOUNTER — Encounter: Payer: Self-pay | Admitting: Internal Medicine

## 2012-04-15 VITALS — BP 140/60 | HR 75 | Ht 62.0 in | Wt 123.8 lb

## 2012-04-15 DIAGNOSIS — K5900 Constipation, unspecified: Secondary | ICD-10-CM

## 2012-04-15 DIAGNOSIS — R1314 Dysphagia, pharyngoesophageal phase: Secondary | ICD-10-CM

## 2012-04-15 DIAGNOSIS — Z86010 Personal history of colonic polyps: Secondary | ICD-10-CM

## 2012-04-15 DIAGNOSIS — R1310 Dysphagia, unspecified: Secondary | ICD-10-CM

## 2012-04-15 DIAGNOSIS — K219 Gastro-esophageal reflux disease without esophagitis: Secondary | ICD-10-CM

## 2012-04-15 DIAGNOSIS — K222 Esophageal obstruction: Secondary | ICD-10-CM

## 2012-04-15 DIAGNOSIS — Q391 Atresia of esophagus with tracheo-esophageal fistula: Secondary | ICD-10-CM

## 2012-04-15 MED ORDER — DEXLANSOPRAZOLE 60 MG PO CPDR: 60.0000 mg | DELAYED_RELEASE_CAPSULE | Freq: Every evening | ORAL | Status: DC

## 2012-04-15 NOTE — Patient Instructions (Addendum)
Continue taking Dexilant  Follow up with Dr. Rhea Belton in 1 year, or sooner if needed for dialation

## 2012-04-15 NOTE — Progress Notes (Signed)
  Subjective:    Patient ID: Cheryl Mcguire, female    DOB: 1946/01/05, 66 y.o.   MRN: [id number]  HPI Cheryl Mcguire is a 66 yo female with PMH of COPD, hypertension, hyperlipidemia, remote CVA, peripheral vascular disease, history of colon polyps, history of ischemic colitis, and GERD with Schatzki's ring who is seen in followup after procedures. She had an upper endoscopy on 03/05/2012 which revealed a Schatzki's ring, 4 cm hiatus hernia and mild gastroduodenitis. Dilation was performed to 16 mm with savory and biopsies were negative for H. pylori or metaplasia/dysplasia.  Colonoscopy performed on the same day revealed rectosigmoid polyps and diverticulosis and mild melanosis with internal hemorrhoids. Pathology was mixed hyperplastic and adenomatous polyps, no high-grade dysplasia or malignancy.  Today she reports she is doing better from a swallowing and GERD standpoint. She remains on Dexilant which is working very well for her heartburn. Her solid food dysphagia has improved, though she still has occasional issues swallowing pills. No trouble with liquids. No trouble with nausea or vomiting. Appetite has been good but somewhat limited as she's been using narcotics recently due to the shoulder surgery. She is still having some right shoulder discomfort and is still wearing a sling. She's had some constipation do to the narcotics but no rectal bleeding or lower abdominal pain.  Review of Systems As per history of present illness otherwise negative  Current Medications, Allergies, Past Medical History, Past Surgical History, Family History and Social History were reviewed in [employer] record.     Objective:   Physical Exam BP 140/60  Pulse 75  Ht 5\' 2"  (1.575 m)  Wt 123 lb 12.8 oz (56.155 kg)  BMI 22.64 kg/m2  SpO2 98% Constitutional: Well-developed and well-nourished. No distress. HEENT: Normocephalic and atraumatic. Oropharynx is clear and moist. No oropharyngeal  exudate. Conjunctivae are normal.  No scleral icterus. Cardiovascular: Normal rate, regular rhythm and intact distal pulses. No M/R/G Pulmonary/chest: Effort normal and breath sounds normal. No wheezing, rales or rhonchi. Abdominal: Soft, nontender, nondistended. Bowel sounds active throughout. There are no masses palpable. No hepatosplenomegaly. Extremities: no clubbing, cyanosis, or edema Lymphadenopathy: No cervical adenopathy noted. Neurological: Alert and oriented to person place and time. Psychiatric: Normal mood and affect. Behavior is normal.     Assessment & Plan:  66 yo female with PMH of COPD, hypertension, hyperlipidemia, remote CVA, peripheral vascular disease, history of colon polyps, history of ischemic colitis, and GERD with Schatzki's ring who is seen in followup after procedures  1. GERD and Schatzki's ring -- the patient's symptoms are well-controlled on Dexilant 60 mg daily she will continue with this dose for now. She has had some improvement in her dysphagia after dilation. We have discussed how the durability of response can vary among patients. She may require repeat dilation in the future, but we will base this on symptoms. She is asked to let us know if her symptoms return before one year. I'll see her back in one year or sooner if needed.  2. History of colon polyps -- I recommended repeat colonoscopy in 3 years and she is aware of this recommendation  3.  Mild constipation -- likely related to narcotic use around her shoulder surgery. I recommended daily MiraLAX until she is off narcotics.

## 2012-04-17 ENCOUNTER — Encounter (HOSPITAL_COMMUNITY)
Admission: RE | Admit: 2012-04-17 | Discharge: 2012-04-17 | Disposition: A | Discharge status: Home / Self Care | Payer: PRIVATE HEALTH INSURANCE | Source: Ambulatory Visit | Attending: Endocrinology | Admitting: Endocrinology

## 2012-04-17 DIAGNOSIS — E0590 Thyrotoxicosis, unspecified without thyrotoxic crisis or storm: Secondary | ICD-10-CM | POA: Insufficient documentation

## 2012-04-17 DIAGNOSIS — E042 Nontoxic multinodular goiter: Secondary | ICD-10-CM

## 2012-04-17 MED ORDER — SODIUM IODIDE I 131 CAPSULE
33.4000 | Freq: Once | INTRAVENOUS | Status: AC | PRN
  Administered 2012-04-17: 33.4 via ORAL

## 2012-04-23 ENCOUNTER — Ambulatory Visit (INDEPENDENT_AMBULATORY_CARE_PROVIDER_SITE_OTHER): Payer: PRIVATE HEALTH INSURANCE | Admitting: Endocrinology

## 2012-04-23 ENCOUNTER — Telehealth: Payer: Self-pay | Admitting: Internal Medicine

## 2012-04-23 ENCOUNTER — Encounter: Payer: Self-pay | Admitting: Endocrinology

## 2012-04-23 VITALS — BP 128/72 | HR 66 | Temp 97.1°F

## 2012-04-23 DIAGNOSIS — R059 Cough, unspecified: Secondary | ICD-10-CM

## 2012-04-23 DIAGNOSIS — R05 Cough: Secondary | ICD-10-CM

## 2012-04-23 MED ORDER — CEFUROXIME AXETIL 250 MG PO TABS: 250.0000 mg | ORAL_TABLET | Freq: Two times a day (BID) | ORAL | Status: AC

## 2012-04-23 NOTE — Patient Instructions (Addendum)
i have sent a prescription to your pharmacy, for an antibiotic pill. I hope you feel better soon.  If you don't feel better by next week, please call back.  Please call sooner if you get worse. Loratadine (non-prescription) will help your congestion.

## 2012-04-23 NOTE — Telephone Encounter (Signed)
Caller: [name]/Patient; Patient Name: Cheryl Mcguire; PCP: Romero Belling (Adults only); Best Callback Phone Number: [phone] Had Iodine Radiation treatment to Thyroid on 04/17/12 and today nodules in neck tender and she has occasional productive cough with clear mucos. Sore Throat since intubation for Rotator Cuff Surgery on 04/09/12- worse since Radiation Therapy. Bloody nasal drainage X 2  on 04/19/12. Headaches on and off since 04/17/12. Triage per Cough Protocol and appointment advised within 4 hours for "new or worsening cough AND known cardiac or respiratory condition". APPOINTMENT SCHEDULED FOR 1615 TODAY 04/23/12. PLEASE ASK DR. ELLISON IF IT IS OKAY TO WAIT > 4 HOURS TO HAVE HER CHECKED AND CALL HER BACK IF HE WANTS TO SEE HER SOONER. JK/CAN

## 2012-04-23 NOTE — Progress Notes (Signed)
Subjective:    Patient ID: Cheryl Mcguire, female    DOB: [birth year], 66 y.o.   MRN: [id number]  HPI Pt states 1 week of moderate prod-quality cough in the chest, and assoc rhinorrhea Past Medical History  Diagnosis Date  . Genital warts   . Heart disease   . History of colon polyps   . Arthritis   . Vulva cancer 1975  . COPD (chronic obstructive pulmonary disease)   . Depression   . Emphysema of lung   . Hypertension   . Hyperlipidemia   . Multinodular goiter (nontoxic)   . 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 Ischemia, bowel   . Carotid artery stenosis     bilateral  . Hyperthyroidism     overactive thyroid   . Shortness of breath     with exertion   . GERD (gastroesophageal reflux disease)     Past Surgical History  Procedure Date  . Cholecystectomy   . Tubal ligation   . Appendectomy   . Carpal tunnel release   . Nose surgery   . Abdominal hysterectomy 1964  . Shoulder open rotator cuff repair 04/09/2012    Procedure: ROTATOR CUFF REPAIR SHOULDER OPEN;  Surgeon: Javier Docker, MD;  Location: [facility];  Service: Orthopedics;  Laterality: Right;  Right Open Mini Rotator Cuff Repair, Distal Clavicle Resection      History   Social History  . Marital Status: Legally Separated    Spouse Name: N/A    Number of Children: 3  . Years of Education: GED   Occupational History  . Retired-Disability    Social History Main Topics  . Smoking status: Current Every Day Smoker -- 1.5 packs/day for 50 years  . Smokeless tobacco: Never Used  . Alcohol Use: Yes     rare  . Drug Use: No  . Sexually Active:    Other Topics Concern  . Not on file   Social History Narrative   Daily caffeine     Current Outpatient Prescriptions on File Prior to Visit  Medication Sig Dispense Refill  . amLODipine (NORVASC) 10 MG tablet Take 10 mg by mouth at bedtime.      . clonazePAM (KLONOPIN) 0.5 MG tablet Take 0.5 mg by mouth daily.      . clopidogrel (PLAVIX) 75 MG  tablet Take 1 tablet (75 mg total) by mouth daily.  90 tablet  3  . dexlansoprazole (DEXILANT) 60 MG capsule Take 1 capsule (60 mg total) by mouth every evening.  30 capsule  9  . FLUoxetine (PROZAC) 40 MG capsule Take 40 mg by mouth at bedtime.      Marland Kitchen lisinopril (PRINIVIL,ZESTRIL) 20 MG tablet Take 20 mg by mouth at bedtime.      . Mometasone Furo-Formoterol Fum (DULERA) 200-5 MCG/ACT AERO Inhale 1 puff into the lungs 2 (two) times daily.  13 g  1  . pramipexole (MIRAPEX) 0.25 MG tablet Take 0.25 mg by mouth every evening.      . traMADol (ULTRAM) 50 MG tablet Take 50 mg by mouth every 6 (six) hours as needed. Pain      . traZODone (DESYREL) 50 MG tablet Take 50-100 mg by mouth at bedtime.        No Known Allergies  Family History  Problem Relation Age of Onset  . Alcohol abuse Other   . Arthritis Other   . Heart disease Other   . Stroke Other   .  Kidney disease Other   . Diabetes Other   . Hypertension Other   . Arthritis Other   . Heart disease Other   . Mental illness Other   . Diabetes Other   . Hypertension Other   . Alcohol abuse Other   . Hyperlipidemia Other   . Heart disease Other   . Hypertension Other   . Mental illness Other   . Diabetes Other   . Colon cancer Neg Hx   . Colon polyps Sister     BP 128/72  Pulse 66  Temp 97.1 F (36.2 C) (Oral)  SpO2 97%  Review of Systems Denies fever and wheezing.      Objective:   Physical Exam VITAL SIGNS:  See vs page GENERAL: no distress head: no deformity eyes: no periorbital swelling, no proptosis external nose and ears are normal mouth: no lesion seen Both eac's and tm's are normal LUNGS:  Clear to auscultation     Assessment & Plan:  Acute bronchitis, new

## 2012-04-29 ENCOUNTER — Institutional Professional Consult (permissible substitution): Payer: PRIVATE HEALTH INSURANCE | Admitting: Pulmonary Disease

## 2012-05-06 ENCOUNTER — Other Ambulatory Visit: Payer: Self-pay

## 2012-05-06 MED ORDER — TRAMADOL HCL 50 MG PO TABS: 50.0000 mg | ORAL_TABLET | Freq: Four times a day (QID) | ORAL | Status: DC | PRN

## 2012-05-06 NOTE — Telephone Encounter (Signed)
Done erx 

## 2012-05-27 ENCOUNTER — Telehealth: Payer: Self-pay | Admitting: Internal Medicine

## 2012-05-27 NOTE — Telephone Encounter (Signed)
Caller: Tiearra/Patient; Patient Name: Cheryl Mcguire; PCP: Oliver Barre (Adults only); Best Callback Phone Number: [phone].  Pt.s mother passed away last week, and she is having difficulty coping.  Pt. states her stomach feels as though it is in knots with grief.  Triaged with Depression, and the Disposition:  See Provider within 24 hours for:  Unable to cope.  Appointment scheduled with Dr. Jonny Ruiz for 05/28/12 at 4 pm.   PT. IS ASKING THAT SOMETHING BE CALLED IN TODAY, TO HELP HER UNTIL HER APPOINTMENT TOMORROW.  PLEASE CONTACT PT. WHEN THIS IS COMPLETED.  CAN/db.

## 2012-05-28 ENCOUNTER — Ambulatory Visit (INDEPENDENT_AMBULATORY_CARE_PROVIDER_SITE_OTHER): Payer: PRIVATE HEALTH INSURANCE | Admitting: Internal Medicine

## 2012-05-28 ENCOUNTER — Encounter: Payer: Self-pay | Admitting: Internal Medicine

## 2012-05-28 VITALS — BP 122/70 | HR 90 | Temp 97.0°F | Ht 62.0 in | Wt 119.4 lb

## 2012-05-28 DIAGNOSIS — F419 Anxiety disorder, unspecified: Secondary | ICD-10-CM

## 2012-05-28 DIAGNOSIS — F411 Generalized anxiety disorder: Secondary | ICD-10-CM

## 2012-05-28 DIAGNOSIS — F341 Dysthymic disorder: Secondary | ICD-10-CM

## 2012-05-28 DIAGNOSIS — F17200 Nicotine dependence, unspecified, uncomplicated: Secondary | ICD-10-CM

## 2012-05-28 DIAGNOSIS — J449 Chronic obstructive pulmonary disease, unspecified: Secondary | ICD-10-CM

## 2012-05-28 DIAGNOSIS — Z7189 Other specified counseling: Secondary | ICD-10-CM

## 2012-05-28 DIAGNOSIS — F329 Major depressive disorder, single episode, unspecified: Secondary | ICD-10-CM

## 2012-05-28 DIAGNOSIS — I10 Essential (primary) hypertension: Secondary | ICD-10-CM

## 2012-05-28 DIAGNOSIS — Z720 Tobacco use: Secondary | ICD-10-CM

## 2012-05-28 DIAGNOSIS — F418 Other specified anxiety disorders: Secondary | ICD-10-CM

## 2012-05-28 MED ORDER — CLONAZEPAM 0.5 MG PO TABS: 0.5000 mg | ORAL_TABLET | Freq: Two times a day (BID) | ORAL | Status: DC | PRN

## 2012-05-28 MED ORDER — VARENICLINE TARTRATE 1 MG PO TABS: 1.0000 mg | ORAL_TABLET | Freq: Two times a day (BID) | ORAL | Status: DC

## 2012-05-28 MED ORDER — ESCITALOPRAM OXALATE 10 MG PO TABS: 10.0000 mg | ORAL_TABLET | Freq: Every day | ORAL | Status: DC

## 2012-05-28 MED ORDER — VARENICLINE TARTRATE 0.5 MG X 11 & 1 MG X 42 PO MISC: ORAL | Status: DC

## 2012-05-28 MED ORDER — TRAMADOL HCL 50 MG PO TABS: 50.0000 mg | ORAL_TABLET | Freq: Four times a day (QID) | ORAL | Status: DC | PRN

## 2012-05-28 NOTE — Telephone Encounter (Signed)
Patient informed. 

## 2012-05-28 NOTE — Assessment & Plan Note (Signed)
Ok for chantix re start,  to f/u any worsening symptoms or concerns

## 2012-05-28 NOTE — Patient Instructions (Addendum)
OK to hold off on taking the lexapro Take all new medications as prescribed - the clonazepam Continue all other medications as before, including the prozac, and the tramadol was also refilled You are also given the chantix prescription to use in a month or two You will be contacted regarding the referral for: counseling (psychology) Please keep your appointments with your specialists as you have planned  (you are due about now) - Dr Everardo All

## 2012-05-28 NOTE — Progress Notes (Signed)
Subjective:    Patient ID: Cheryl Mcguire, female    DOB: 04-29-1946, 66 y.o.   MRN: [id number]  HPI  Here after mother died approx 10 days ago suddenly at home due to stroke, did well emotionally for the first wk, but after the funeral and the stiff upper lip for the family, the last 3 days have become quite difficult with marked increased sadness and grief, with marked fatigue, anxiety, tearfulness, and near panic.  Overall good compliance with treatment, and good medicine tolerability, including the prozac which she is still taking.  Has run out of the clonazepam.  She agrees today counseling would be a good idea, as no hospice was involved with her mother's passing.  Is very interested in chantix, and has started the first wk before her mother passed then stopped, would like to re-start but realizes she will need to hold off for 1-2 mo given the current situation.  Denies suicidal ideation.   Pt denies fever, wt loss, night sweats, loss of appetite, or other constitutional symptoms  Pt denies chest pain, increased sob or doe, wheezing, orthopnea, PND, increased LE swelling, palpitations, dizziness or syncope.  No other specific somatic symptoms Past Medical History  Diagnosis Date  . Genital warts   . Heart disease   . History of colon polyps   . Arthritis   . Vulva cancer 1975  . COPD (chronic obstructive pulmonary disease)   . Depression   . Emphysema of lung   . Hypertension   . Hyperlipidemia   . Multinodular goiter (nontoxic)   . 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 Ischemia, bowel   . Carotid artery stenosis     bilateral  . Hyperthyroidism     overactive thyroid   . Shortness of breath     with exertion   . GERD (gastroesophageal reflux disease)    Past Surgical History  Procedure Date  . Cholecystectomy   . Tubal ligation   . Appendectomy   . Carpal tunnel release   . Nose surgery   . Abdominal hysterectomy 1964  . Shoulder open rotator cuff repair  04/09/2012    Procedure: ROTATOR CUFF REPAIR SHOULDER OPEN;  Surgeon: Javier Docker, MD;  Location: [facility];  Service: Orthopedics;  Laterality: Right;  Right Open Mini Rotator Cuff Repair, Distal Clavicle Resection      reports that she has been smoking.  She has never used smokeless tobacco. She reports that she drinks alcohol. She reports that she does not use illicit drugs. family history includes Alcohol abuse in her others; Arthritis in her others; Colon polyps in her sister; Diabetes in her others; Heart disease in her others; Hyperlipidemia in her other; Hypertension in her others; Kidney disease in her other; Mental illness in her others; and Stroke in her other.  There is no history of Colon cancer. No Known Allergies Current Outpatient Prescriptions on File Prior to Visit  Medication Sig Dispense Refill  . amLODipine (NORVASC) 10 MG tablet Take 10 mg by mouth at bedtime.      . clopidogrel (PLAVIX) 75 MG tablet Take 1 tablet (75 mg total) by mouth daily.  90 tablet  3  . dexlansoprazole (DEXILANT) 60 MG capsule Take 1 capsule (60 mg total) by mouth every evening.  30 capsule  9  . FLUoxetine (PROZAC) 40 MG capsule Take 40 mg by mouth at bedtime.      Marland Kitchen HYDROcodone-acetaminophen (NORCO/VICODIN) 5-325 MG per tablet  Take 1 tablet by mouth every 6 (six) hours as needed.      Marland Kitchen lisinopril (PRINIVIL,ZESTRIL) 20 MG tablet Take 20 mg by mouth at bedtime.      . Mometasone Furo-Formoterol Fum (DULERA) 200-5 MCG/ACT AERO Inhale 1 puff into the lungs 2 (two) times daily.  13 g  1  . pramipexole (MIRAPEX) 0.25 MG tablet Take 0.25 mg by mouth every evening.      . traZODone (DESYREL) 50 MG tablet Take 50-100 mg by mouth at bedtime.       Review of Systems  Constitutional: Negative for diaphoresis and unexpected weight change.  HENT: Negative for tinnitus.   Eyes: Negative for photophobia and visual disturbance.  Respiratory: Negative for choking and stridor.   Gastrointestinal: Negative for  vomiting and blood in stool.  Genitourinary: Negative for hematuria and decreased urine volume.  Musculoskeletal: Negative for gait problem.  Skin: Negative for color change and wound.  Neurological: Negative for tremors and numbness.  Psychiatric/Behavioral: Negative for decreased concentration. The patient is not hyperactive.       Objective:   Physical Exam BP 122/70  Pulse 90  Temp 97 F (36.1 C) (Oral)  Ht 5\' 2"  (1.575 m)  Wt 119 lb 6 oz (54.148 kg)  BMI 21.83 kg/m2  SpO2 96% Physical Exam  VS noted Constitutional: Pt appears well-developed and well-nourished.  HENT: Head: Normocephalic.  Right Ear: External ear normal.  Left Ear: External ear normal.  Eyes: Conjunctivae and EOM are normal. Pupils are equal, round, and reactive to light.  Neck: Normal range of motion. Neck supple.  Cardiovascular: Normal rate and regular rhythm.   Pulmonary/Chest: Effort normal and breath sounds normal.  Neurological: Pt is alert. Not confused  Skin: Skin is warm. No erythema.  Psychiatric: Pt behavior is normal. Thought content normal. But sad, tearful, 2+ nervous    Assessment & Plan:

## 2012-05-28 NOTE — Assessment & Plan Note (Signed)
stable overall by hx and exam, most recent data reviewed with pt, and pt to continue medical treatment as before Lab Results  Component Value Date   WBC 12.0* 04/01/2012   HGB 12.6 04/01/2012   HCT 37.7 04/01/2012   PLT 375 04/01/2012   GLUCOSE 111* 04/10/2012   CHOL 180 01/17/2012   TRIG 237.0* 01/17/2012   HDL 38.10* 01/17/2012   LDLDIRECT 116.9 01/17/2012   ALT 9 04/01/2012   AST 11 04/01/2012   NA 135 04/10/2012   K 4.2 04/10/2012   CL 100 04/10/2012   CREATININE 1.01 04/10/2012   BUN 10 04/10/2012   CO2 27 04/10/2012   TSH 0.22* 01/17/2012   INR 0.84 04/01/2012   To cont the prozac 40

## 2012-05-28 NOTE — Telephone Encounter (Signed)
Ok for lexapro 10  Please keep appt as per note

## 2012-05-28 NOTE — Assessment & Plan Note (Signed)
stable overall by hx and exam, most recent data reviewed with pt, and pt to continue medical treatment as before BP Readings from Last 3 Encounters:  05/28/12 122/70  04/23/12 128/72  04/15/12 140/60

## 2012-05-28 NOTE — Assessment & Plan Note (Signed)
stable overall by hx and exam, most recent data reviewed with pt, and pt to continue medical treatment as before SpO2 Readings from Last 3 Encounters:  05/28/12 96%  04/23/12 97%  04/15/12 98%

## 2012-05-28 NOTE — Assessment & Plan Note (Signed)
With grief rxn - for clonazepam prn refill, refer counseling

## 2012-06-10 ENCOUNTER — Ambulatory Visit: Payer: PRIVATE HEALTH INSURANCE | Admitting: Psychiatry

## 2012-06-27 ENCOUNTER — Other Ambulatory Visit: Payer: Self-pay | Admitting: Internal Medicine

## 2012-07-03 ENCOUNTER — Telehealth: Payer: Self-pay | Admitting: Internal Medicine

## 2012-07-03 MED ORDER — TRAZODONE HCL 50 MG PO TABS: 50.0000 mg | ORAL_TABLET | Freq: Every day | ORAL | Status: DC

## 2012-07-03 NOTE — Telephone Encounter (Signed)
Patient is calling to request a refill on her Trazadone.  States that she has been taking 100mg  at night as 50 mg does not help her sleep.  Patient has four tablets remaining.  Reviewed medication in chart and see that patient has Trazadone 50-100mg  take once daily at bedtime last refilled in June.  Patient states that she had a refill in October for 50 mg once daily.  This is not reflected in Epic.  Preferred Pharmacy is [facility] - [address]  [phone].

## 2012-07-17 ENCOUNTER — Ambulatory Visit: Payer: 59 | Admitting: Internal Medicine

## 2012-07-21 ENCOUNTER — Ambulatory Visit: Payer: PRIVATE HEALTH INSURANCE | Admitting: Endocrinology

## 2012-08-10 ENCOUNTER — Telehealth: Payer: Self-pay | Admitting: Internal Medicine

## 2012-08-10 NOTE — Telephone Encounter (Signed)
Ok to [employer], unless there is a specific question

## 2012-08-10 NOTE — Telephone Encounter (Signed)
Dawn is a NP at [facility] and she went out to see the patient in her home today and she would like to speak with assistant about some tingling the patient is having in her hands and a dx of Bronchitis from the ER

## 2012-08-11 NOTE — Telephone Encounter (Signed)
Ok to address at next OV with me, or make OV with Rene Kocher sooner if she prefers

## 2012-08-11 NOTE — Telephone Encounter (Signed)
Called the NP with [employer] back.  She was out to see the patient for her 1 yr. Checkup.  She did have to report on the tingling in both hands, which she stated the patient reported she has had for 5 to 10 yrs. And stated is arthritis.  The patient stated worsens when it gets cold.  Otherwise the patient she stated is doing well and getting over bronchitis.  Please advise.

## 2012-08-11 NOTE — Telephone Encounter (Signed)
Called left message to call back 

## 2012-08-11 NOTE — Telephone Encounter (Signed)
All noted;  Please let us know if she changes PCP, we can forward records if needed

## 2012-08-11 NOTE — Telephone Encounter (Signed)
Patient informed of MD instructions.  The patient has lost her car and no longer has transportation to [place] (she lives in [place]).  States she is being advised by [facility] to find a PCP in [place] that would be closer to her home.

## 2012-09-30 ENCOUNTER — Other Ambulatory Visit: Payer: Self-pay | Admitting: Internal Medicine

## 2013-01-05 ENCOUNTER — Other Ambulatory Visit: Payer: Self-pay | Admitting: Internal Medicine

## 2013-01-13 ENCOUNTER — Encounter: Payer: Self-pay | Admitting: Internal Medicine

## 2013-01-15 ENCOUNTER — Telehealth: Payer: Self-pay | Admitting: Internal Medicine

## 2013-01-18 NOTE — Telephone Encounter (Signed)
In this case, follow-up PRN

## 2013-01-18 NOTE — Telephone Encounter (Signed)
Phone not accepting calls

## 2013-01-18 NOTE — Telephone Encounter (Signed)
Tried to call again, phone not accepting calls or VM.

## 2013-01-18 NOTE — Telephone Encounter (Signed)
Phone still not accepting calls.

## 2013-01-20 NOTE — Telephone Encounter (Signed)
Phone still not accepting calls.

## 2013-01-22 NOTE — Telephone Encounter (Signed)
Pt never called back and her phone doesn't accept our calls.

## 2013-06-07 ENCOUNTER — Encounter (HOSPITAL_COMMUNITY): Payer: PRIVATE HEALTH INSURANCE

## 2013-06-08 ENCOUNTER — Encounter (HOSPITAL_COMMUNITY): Payer: PRIVATE HEALTH INSURANCE

## 2013-11-23 ENCOUNTER — Encounter: Payer: Self-pay | Admitting: Internal Medicine

## 2014-02-26 ENCOUNTER — Emergency Department (HOSPITAL_COMMUNITY): Payer: PRIVATE HEALTH INSURANCE

## 2014-02-26 ENCOUNTER — Encounter (HOSPITAL_COMMUNITY): Payer: Self-pay | Admitting: Emergency Medicine

## 2014-02-26 ENCOUNTER — Inpatient Hospital Stay (HOSPITAL_COMMUNITY)
Admission: EM | Admit: 2014-02-26 | Discharge: 2014-03-01 | DRG: 871 | Disposition: A | Discharge status: Home Health | Payer: PRIVATE HEALTH INSURANCE | Attending: Internal Medicine | Admitting: Internal Medicine

## 2014-02-26 DIAGNOSIS — Z833 Family history of diabetes mellitus: Secondary | ICD-10-CM | POA: Diagnosis not present

## 2014-02-26 DIAGNOSIS — E0590 Thyrotoxicosis, unspecified without thyrotoxic crisis or storm: Secondary | ICD-10-CM | POA: Diagnosis present

## 2014-02-26 DIAGNOSIS — I10 Essential (primary) hypertension: Secondary | ICD-10-CM | POA: Diagnosis present

## 2014-02-26 DIAGNOSIS — M129 Arthropathy, unspecified: Secondary | ICD-10-CM | POA: Diagnosis present

## 2014-02-26 DIAGNOSIS — E872 Acidosis, unspecified: Secondary | ICD-10-CM | POA: Diagnosis present

## 2014-02-26 DIAGNOSIS — R6520 Severe sepsis without septic shock: Secondary | ICD-10-CM

## 2014-02-26 DIAGNOSIS — F3289 Other specified depressive episodes: Secondary | ICD-10-CM | POA: Diagnosis present

## 2014-02-26 DIAGNOSIS — R5381 Other malaise: Secondary | ICD-10-CM | POA: Diagnosis present

## 2014-02-26 DIAGNOSIS — J189 Pneumonia, unspecified organism: Secondary | ICD-10-CM | POA: Diagnosis present

## 2014-02-26 DIAGNOSIS — D62 Acute posthemorrhagic anemia: Secondary | ICD-10-CM | POA: Diagnosis present

## 2014-02-26 DIAGNOSIS — R5383 Other fatigue: Secondary | ICD-10-CM | POA: Diagnosis present

## 2014-02-26 DIAGNOSIS — F329 Major depressive disorder, single episode, unspecified: Secondary | ICD-10-CM | POA: Diagnosis present

## 2014-02-26 DIAGNOSIS — D509 Iron deficiency anemia, unspecified: Secondary | ICD-10-CM | POA: Diagnosis present

## 2014-02-26 DIAGNOSIS — Z8701 Personal history of pneumonia (recurrent): Secondary | ICD-10-CM | POA: Diagnosis not present

## 2014-02-26 DIAGNOSIS — K219 Gastro-esophageal reflux disease without esophagitis: Secondary | ICD-10-CM | POA: Diagnosis present

## 2014-02-26 DIAGNOSIS — J9601 Acute respiratory failure with hypoxia: Secondary | ICD-10-CM | POA: Diagnosis present

## 2014-02-26 DIAGNOSIS — F17200 Nicotine dependence, unspecified, uncomplicated: Secondary | ICD-10-CM | POA: Diagnosis present

## 2014-02-26 DIAGNOSIS — Z8673 Personal history of transient ischemic attack (TIA), and cerebral infarction without residual deficits: Secondary | ICD-10-CM | POA: Diagnosis not present

## 2014-02-26 DIAGNOSIS — Z7902 Long term (current) use of antithrombotics/antiplatelets: Secondary | ICD-10-CM

## 2014-02-26 DIAGNOSIS — E785 Hyperlipidemia, unspecified: Secondary | ICD-10-CM | POA: Diagnosis present

## 2014-02-26 DIAGNOSIS — Z823 Family history of stroke: Secondary | ICD-10-CM | POA: Diagnosis not present

## 2014-02-26 DIAGNOSIS — Z8544 Personal history of malignant neoplasm of other female genital organs: Secondary | ICD-10-CM

## 2014-02-26 DIAGNOSIS — M79609 Pain in unspecified limb: Secondary | ICD-10-CM

## 2014-02-26 DIAGNOSIS — J441 Chronic obstructive pulmonary disease with (acute) exacerbation: Secondary | ICD-10-CM | POA: Diagnosis present

## 2014-02-26 DIAGNOSIS — D72829 Elevated white blood cell count, unspecified: Secondary | ICD-10-CM | POA: Diagnosis present

## 2014-02-26 DIAGNOSIS — A419 Sepsis, unspecified organism: Secondary | ICD-10-CM | POA: Diagnosis present

## 2014-02-26 DIAGNOSIS — F419 Anxiety disorder, unspecified: Secondary | ICD-10-CM | POA: Diagnosis present

## 2014-02-26 DIAGNOSIS — J9600 Acute respiratory failure, unspecified whether with hypoxia or hypercapnia: Secondary | ICD-10-CM | POA: Diagnosis present

## 2014-02-26 DIAGNOSIS — Z720 Tobacco use: Secondary | ICD-10-CM

## 2014-02-26 DIAGNOSIS — F411 Generalized anxiety disorder: Secondary | ICD-10-CM | POA: Diagnosis present

## 2014-02-26 DIAGNOSIS — F32A Depression, unspecified: Secondary | ICD-10-CM | POA: Diagnosis present

## 2014-02-26 DIAGNOSIS — N179 Acute kidney failure, unspecified: Secondary | ICD-10-CM | POA: Diagnosis present

## 2014-02-26 DIAGNOSIS — Z9089 Acquired absence of other organs: Secondary | ICD-10-CM | POA: Diagnosis not present

## 2014-02-26 DIAGNOSIS — R0602 Shortness of breath: Secondary | ICD-10-CM

## 2014-02-26 DIAGNOSIS — E039 Hypothyroidism, unspecified: Secondary | ICD-10-CM | POA: Diagnosis present

## 2014-02-26 LAB — CBC
HCT: 36.5 % (ref 36.0–46.0)
HCT: 32.5 % — ABNORMAL LOW (ref 36.0–46.0)
Hemoglobin: 12 g/dL (ref 12.0–15.0)
Hemoglobin: 10.8 g/dL — ABNORMAL LOW (ref 12.0–15.0)
MCH: 28.7 pg (ref 26.0–34.0)
MCH: 29 pg (ref 26.0–34.0)
MCHC: 32.9 g/dL (ref 30.0–36.0)
MCHC: 33.2 g/dL (ref 30.0–36.0)
MCV: 87.3 fL (ref 78.0–100.0)
MCV: 87.1 fL (ref 78.0–100.0)
PLATELETS: 296 10*3/uL (ref 150–400)
Platelets: 300 10*3/uL (ref 150–400)
RBC: 4.18 MIL/uL (ref 3.87–5.11)
RBC: 3.73 MIL/uL — ABNORMAL LOW (ref 3.87–5.11)
RDW: 14.5 % (ref 11.5–15.5)
RDW: 14.6 % (ref 11.5–15.5)
WBC: 23.2 10*3/uL — ABNORMAL HIGH (ref 4.0–10.5)
WBC: 20.2 10*3/uL — ABNORMAL HIGH (ref 4.0–10.5)

## 2014-02-26 LAB — I-STAT CG4 LACTIC ACID, ED: LACTIC ACID, VENOUS: 2.84 mmol/L — AB (ref 0.5–2.2)

## 2014-02-26 LAB — CREATININE, SERUM
Creatinine, Ser: 1.36 mg/dL — ABNORMAL HIGH (ref 0.50–1.10)
GFR calc Af Amer: 46 mL/min — ABNORMAL LOW
GFR calc non Af Amer: 39 mL/min — ABNORMAL LOW

## 2014-02-26 LAB — BASIC METABOLIC PANEL
Anion gap: 14 (ref 5–15)
BUN: 15 mg/dL (ref 6–23)
CO2: 22 mEq/L (ref 19–32)
Calcium: 9.3 mg/dL (ref 8.4–10.5)
Chloride: 103 mEq/L (ref 96–112)
Creatinine, Ser: 1.32 mg/dL — ABNORMAL HIGH (ref 0.50–1.10)
GFR calc Af Amer: 47 mL/min — ABNORMAL LOW (ref >90)
GFR calc non Af Amer: 41 mL/min — ABNORMAL LOW (ref >90)
Glucose, Bld: 116 mg/dL — ABNORMAL HIGH (ref 70–99)
Potassium: 4.2 mEq/L (ref 3.7–5.3)
Sodium: 139 mEq/L (ref 137–147)

## 2014-02-26 LAB — PRO B NATRIURETIC PEPTIDE: Pro B Natriuretic peptide (BNP): 254.9 pg/mL — ABNORMAL HIGH (ref 0–125)

## 2014-02-26 LAB — TROPONIN I

## 2014-02-26 MED ORDER — LEVOTHYROXINE SODIUM 112 MCG PO TABS
112.0000 ug | ORAL_TABLET | Freq: Every day | ORAL | Status: DC
  Administered 2014-02-26 – 2014-02-28 (×3): 112 ug via ORAL
  Filled 2014-02-26 (×5): qty 1

## 2014-02-26 MED ORDER — METHYLPREDNISOLONE SODIUM SUCC 125 MG IJ SOLR
125.0000 mg | Freq: Once | INTRAMUSCULAR | Status: AC
  Administered 2014-02-26: 125 mg via INTRAVENOUS
  Filled 2014-02-26: qty 2

## 2014-02-26 MED ORDER — ALPRAZOLAM 0.25 MG PO TABS
0.2500 mg | ORAL_TABLET | Freq: Two times a day (BID) | ORAL | Status: DC | PRN
  Administered 2014-02-26 – 2014-03-01 (×4): 0.25 mg via ORAL
  Filled 2014-02-26 (×4): qty 1

## 2014-02-26 MED ORDER — DEXTROSE 5 % IV SOLN: 500.0000 mg | INTRAVENOUS | Status: DC

## 2014-02-26 MED ORDER — PRAMIPEXOLE DIHYDROCHLORIDE 0.25 MG PO TABS
0.2500 mg | ORAL_TABLET | Freq: Every evening | ORAL | Status: DC
  Administered 2014-02-26 – 2014-02-28 (×3): 0.25 mg via ORAL
  Filled 2014-02-26 (×4): qty 1

## 2014-02-26 MED ORDER — CLOPIDOGREL BISULFATE 75 MG PO TABS
75.0000 mg | ORAL_TABLET | Freq: Every day | ORAL | Status: DC
  Administered 2014-02-26 – 2014-03-01 (×4): 75 mg via ORAL
  Filled 2014-02-26 (×4): qty 1

## 2014-02-26 MED ORDER — FLUOXETINE HCL 20 MG PO CAPS
40.0000 mg | ORAL_CAPSULE | Freq: Every day | ORAL | Status: DC
  Administered 2014-02-26 – 2014-02-28 (×3): 40 mg via ORAL
  Filled 2014-02-26 (×4): qty 2

## 2014-02-26 MED ORDER — METHYLPREDNISOLONE SODIUM SUCC 125 MG IJ SOLR
60.0000 mg | Freq: Two times a day (BID) | INTRAMUSCULAR | Status: DC
  Administered 2014-02-26 – 2014-02-28 (×4): 60 mg via INTRAVENOUS
  Filled 2014-02-26 (×5): qty 0.96

## 2014-02-26 MED ORDER — TRAZODONE HCL 50 MG PO TABS
50.0000 mg | ORAL_TABLET | Freq: Every day | ORAL | Status: DC
  Administered 2014-02-26 – 2014-02-28 (×3): 50 mg via ORAL
  Filled 2014-02-26 (×4): qty 1

## 2014-02-26 MED ORDER — SODIUM CHLORIDE 0.9 % IJ SOLN
3.0000 mL | Freq: Two times a day (BID) | INTRAMUSCULAR | Status: DC
  Administered 2014-02-28 (×2): 3 mL via INTRAVENOUS

## 2014-02-26 MED ORDER — NICOTINE 21 MG/24HR TD PT24: 21.0000 mg | MEDICATED_PATCH | Freq: Every day | TRANSDERMAL | Status: DC

## 2014-02-26 MED ORDER — HYDROCODONE-ACETAMINOPHEN 7.5-325 MG PO TABS
1.0000 | ORAL_TABLET | Freq: Four times a day (QID) | ORAL | Status: DC | PRN
  Administered 2014-02-26 – 2014-02-28 (×3): 1 via ORAL
  Filled 2014-02-26 (×3): qty 1

## 2014-02-26 MED ORDER — DEXTROSE 5 % IV SOLN: 1.0000 g | Freq: Once | INTRAVENOUS | Status: DC

## 2014-02-26 MED ORDER — HEPARIN SODIUM (PORCINE) 5000 UNIT/ML IJ SOLN
5000.0000 [IU] | Freq: Three times a day (TID) | INTRAMUSCULAR | Status: DC
  Administered 2014-02-26 – 2014-03-01 (×8): 5000 [IU] via SUBCUTANEOUS
  Filled 2014-02-26 (×11): qty 1

## 2014-02-26 MED ORDER — DEXTROSE 5 % IV SOLN
500.0000 mg | Freq: Once | INTRAVENOUS | Status: AC
  Administered 2014-02-26: 500 mg via INTRAVENOUS
  Filled 2014-02-26: qty 500

## 2014-02-26 MED ORDER — AZITHROMYCIN 500 MG PO TABS
500.0000 mg | ORAL_TABLET | ORAL | Status: DC
  Administered 2014-02-27 – 2014-02-28 (×2): 500 mg via ORAL
  Filled 2014-02-26 (×3): qty 1

## 2014-02-26 MED ORDER — SODIUM CHLORIDE 0.9 % IV SOLN
INTRAVENOUS | Status: DC
  Administered 2014-02-26: 75 mL via INTRAVENOUS
  Administered 2014-02-27: 13:00:00 via INTRAVENOUS

## 2014-02-26 MED ORDER — IPRATROPIUM BROMIDE 0.02 % IN SOLN
0.5000 mg | Freq: Once | RESPIRATORY_TRACT | Status: AC
  Administered 2014-02-26: 0.5 mg via RESPIRATORY_TRACT
  Filled 2014-02-26: qty 2.5

## 2014-02-26 MED ORDER — IOHEXOL 350 MG/ML SOLN
80.0000 mL | Freq: Once | INTRAVENOUS | Status: AC | PRN
  Administered 2014-02-26: 80 mL via INTRAVENOUS

## 2014-02-26 MED ORDER — DEXTROSE 5 % IV SOLN: 1.0000 g | INTRAVENOUS | Status: DC

## 2014-02-26 MED ORDER — ALBUTEROL SULFATE (2.5 MG/3ML) 0.083% IN NEBU
5.0000 mg | INHALATION_SOLUTION | Freq: Once | RESPIRATORY_TRACT | Status: AC
  Administered 2014-02-26: 5 mg via RESPIRATORY_TRACT
  Filled 2014-02-26: qty 6

## 2014-02-26 MED ORDER — TIOTROPIUM BROMIDE MONOHYDRATE 18 MCG IN CAPS
18.0000 ug | ORAL_CAPSULE | Freq: Every day | RESPIRATORY_TRACT | Status: DC
  Administered 2014-02-27 – 2014-03-01 (×3): 18 ug via RESPIRATORY_TRACT
  Filled 2014-02-26: qty 5

## 2014-02-26 MED ORDER — DEXTROSE 5 % IV SOLN
1.0000 g | INTRAVENOUS | Status: DC
  Administered 2014-02-26 – 2014-02-28 (×3): 1 g via INTRAVENOUS
  Filled 2014-02-26 (×4): qty 10

## 2014-02-26 MED ORDER — SODIUM CHLORIDE 0.9 % IV BOLUS (SEPSIS): 1000.0000 mL | Freq: Once | INTRAVENOUS | Status: DC

## 2014-02-26 MED ORDER — NICOTINE 21 MG/24HR TD PT24
21.0000 mg | MEDICATED_PATCH | Freq: Every day | TRANSDERMAL | Status: DC
  Administered 2014-02-26 – 2014-03-01 (×4): 21 mg via TRANSDERMAL
  Filled 2014-02-26 (×4): qty 1

## 2014-02-26 MED ORDER — PANTOPRAZOLE SODIUM 40 MG PO TBEC
40.0000 mg | DELAYED_RELEASE_TABLET | Freq: Every day | ORAL | Status: DC
  Administered 2014-02-26 – 2014-03-01 (×4): 40 mg via ORAL
  Filled 2014-02-26 (×4): qty 1

## 2014-02-26 NOTE — H&P (Signed)
[facility] History and Physical  Cheryl Mcguire HBZ:[id number] DOB: [birth year] DOA: 02/26/2014  Referring physician:  PCP: Cheryl Cower, MD   Chief Complaint: Cheryl Mcguire  HPI: Cheryl Mcguire is a 68 y.o. female  Past medical history of tobacco abuse, COPD non-oxygen at home, CVA and hypertension Is in for shortness of breath that started the day of admission. She relates she has progressively gotten shortness of breath to the point where she can even walk to the bathroom without being short of breath. She also relates some leg cramping. She also started feeling generally weak this morning not able to stand up due to her weakness. She denies any fevers, productive cough nonbloody, nausea, diarrhea or chest pain.  In the ED: A CBC was done that shows a white count of 23 CT chest to rule out a PE showed an infiltrate as below.   Review of Systems:  Constitutional:  No weight loss, night sweats, Fevers, chills, fatigue.  HEENT:  No headaches, Difficulty swallowing,Tooth/dental problems,Sore throat,  No sneezing, itching, ear ache, nasal congestion, post nasal drip,  Cardio-vascular:  No chest pain, Orthopnea, PND, swelling in lower extremities, anasarca, dizziness, palpitations  GI:  No heartburn, indigestion, abdominal pain, nausea, vomiting, diarrhea, change in bowel habits, loss of appetite  Resp:  No wheezing.No chest wall deformity  Skin:  no rash or lesions.  GU:  no dysuria, change in color of urine, no urgency or frequency. No flank pain.  Musculoskeletal:  No joint pain or swelling. No decreased range of motion. No back pain.  Psych:  No change in mood or affect. No depression or anxiety. No memory loss.   Past Medical History  Diagnosis Date  . Genital warts   . Heart disease   . History of colon polyps   . Arthritis   . COPD (chronic obstructive pulmonary disease)   . Depression   . Emphysema of lung   . Hypertension   . Hyperlipidemia   . Multinodular  goiter (nontoxic)   . Ischemia, bowel   . Carotid artery stenosis     bilateral  . Hyperthyroidism     overactive thyroid   . Shortness of breath     with exertion   . GERD (gastroesophageal reflux disease)   . Vulva cancer 1975  . Stroke 2010    with left leg weakness > L:UE   Past Surgical History  Procedure Laterality Date  . Cholecystectomy    . Tubal ligation    . Appendectomy    . Carpal tunnel release    . Nose surgery    . Abdominal hysterectomy  1964  . Shoulder open rotator cuff repair  04/09/2012    Procedure: ROTATOR CUFF REPAIR SHOULDER OPEN;  Surgeon: Johnn Hai, MD;  Location: [facility];  Service: Orthopedics;  Laterality: Right;  Right Open Mini Rotator Cuff Repair, Distal Clavicle Resection     Social History:  reports that she has been smoking.  She has never used smokeless tobacco. She reports that she drinks alcohol. She reports that she does not use illicit drugs.  No Known Allergies  Family History  Problem Relation Age of Onset  . Alcohol abuse Other   . Arthritis Other   . Heart disease Other   . Stroke Other   . Kidney disease Other   . Diabetes Other   . Hypertension Other   . Arthritis Other   . Heart disease Other   . Mental illness Other   .  Diabetes Other   . Hypertension Other   . Alcohol abuse Other   . Hyperlipidemia Other   . Heart disease Other   . Hypertension Other   . Mental illness Other   . Diabetes Other   . Colon cancer Neg Hx   . Colon polyps Sister   . CVA Mother   . Alcoholism Father   . Cirrhosis Father      Prior to Admission medications   Medication Sig Start Date End Date Taking? Authorizing Provider  ALPRAZolam (XANAX) 0.25 MG tablet Take 0.25 mg by mouth 2 (two) times daily as needed for anxiety.    Yes Historical Provider, MD  amLODipine (NORVASC) 10 MG tablet Take 10 mg by mouth at bedtime. 01/17/12  Yes Biagio Borg, MD  clopidogrel (PLAVIX) 75 MG tablet Take 1 tablet (75 mg total) by mouth daily. 04/07/12   Yes Biagio Borg, MD  FLUoxetine (PROZAC) 40 MG capsule Take 40 mg by mouth at bedtime. 01/17/12  Yes Biagio Borg, MD  HYDROcodone-acetaminophen ([place]) 7.5-325 MG per tablet Take 1 tablet by mouth every 6 (six) hours as needed for moderate pain.   Yes Historical Provider, MD  levothyroxine (SYNTHROID, LEVOTHROID) 112 MCG tablet Take 112 mcg by mouth daily before breakfast.   Yes Historical Provider, MD  lisinopril (PRINIVIL,ZESTRIL) 20 MG tablet Take 20 mg by mouth at bedtime. 01/17/12  Yes Biagio Borg, MD  pantoprazole (PROTONIX) 40 MG tablet Take 40 mg by mouth daily.   Yes Historical Provider, MD  pramipexole (MIRAPEX) 0.25 MG tablet Take 0.25 mg by mouth every evening.   Yes Historical Provider, MD  tiotropium (SPIRIVA) 18 MCG inhalation capsule Place 18 mcg into inhaler and inhale daily.   Yes Historical Provider, MD  traZODone (DESYREL) 50 MG tablet Take 50 mg by mouth at bedtime.   Yes Historical Provider, MD   Physical Exam: Filed Vitals:   02/26/14 1554 02/26/14 1558 02/26/14 1600 02/26/14 1603  BP:  124/59 120/60   Pulse: 87 84    Temp:      TempSrc:      Resp: 22 17    SpO2: 100% 100%  100%    Wt Readings from Last 3 Encounters:  05/28/12 54.148 kg (119 lb 6 oz)  04/15/12 56.155 kg (123 lb 12.8 oz)  04/09/12 55.611 kg (122 lb 9.6 oz)    General:  Appears calm and comfortable, she smells like tobacco Eyes: PERRL, normal lids, irises & conjunctiva ENT: grossly normal hearing, lips & tongue Neck: no LAD, masses or thyromegaly Cardiovascular: RRR, no m/r/g. No LE edema. Telemetry: SR, no arrhythmias  Respiratory: She has poor air movement with crackles on the right middle and lower lobe, and diffuse wheezing bilaterally. Abdomen: soft, ntnd Skin: no rash or induration seen on limited exam Musculoskeletal: grossly normal tone BUE/BLE Psychiatric: grossly normal mood and affect, speech fluent and appropriate Neurologic: grossly non-focal.          Labs on Admission:    Basic Metabolic Panel:  Recent Labs Lab 02/26/14 1243  NA 139  K 4.2  CL 103  CO2 22  GLUCOSE 116*  BUN 15  CREATININE 1.32*  CALCIUM 9.3   Liver Function Tests: No results found for this basename: AST, ALT, ALKPHOS, BILITOT, PROT, ALBUMIN,  in the last 168 hours No results found for this basename: LIPASE, AMYLASE,  in the last 168 hours No results found for this basename: AMMONIA,  in the last 168 hours CBC:  Recent Labs Lab 02/26/14 1243  WBC 23.2*  HGB 12.0  HCT 36.5  MCV 87.3  PLT 300   Cardiac Enzymes:  Recent Labs Lab 02/26/14 1243  TROPONINI <0.30    BNP (last 3 results)  Recent Labs  02/26/14 1243  PROBNP 254.9*   CBG: No results found for this basename: GLUCAP,  in the last 168 hours  Radiological Exams on Admission: Dg Chest 2 View  02/26/2014   CLINICAL DATA:  SHORTNESS OF BREATH  EXAM: CHEST - 2 VIEW  COMPARISON:  01/21/2014  FINDINGS: Mild hyperinflation with coarse patchy bibasilar interstitial markings. No confluent airspace disease or overt edema. Heart size upper limits normal. Mildly tortuous thoracic aorta. Regional bones unremarkable. Surgical clips in the upper abdomen. Orthopedic anchor in the right humeral head.  IMPRESSION: 1. Chronic interstitial changes without acute abnormality.   Electronically Signed   By: Arne Cleveland M.D.   On: 02/26/2014 13:30   Ct Head Wo Contrast  02/26/2014   CLINICAL DATA:  Severe headache  EXAM: CT HEAD WITHOUT CONTRAST  TECHNIQUE: Contiguous axial images were obtained from the base of the skull through the vertex without contrast.  COMPARISON:  08/15/2012  FINDINGS: Normal appearance of the intracranial structures. No evidence for acute hemorrhage, mass lesion, midline shift, hydrocephalus or large infarct. No acute bony abnormality. The visualized sinuses are clear.  IMPRESSION: No acute intracranial abnormality.   Electronically Signed   By: Daryll Brod M.D.   On: 02/26/2014 15:13   Ct Angio Chest Pe  W/cm &/or Wo Cm  02/26/2014   CLINICAL DATA:  r/o pe  EXAM: CT ANGIOGRAPHY CHEST WITH CONTRAST  TECHNIQUE: Multidetector CT imaging of the chest was performed using the standard protocol during bolus administration of intravenous contrast. Multiplanar CT image reconstructions and MIPs were obtained to evaluate the vascular anatomy.  CONTRAST:  16mL OMNIPAQUE IOHEXOL 350 MG/ML SOLN  COMPARISON:  01/16/2014  FINDINGS: Satisfactory opacification of pulmonary arteries noted, and there is no evidence of pulmonary emboli. Adequate contrast opacification of the thoracic aorta with no evidence of dissection, aneurysm, or stenosis. There is classic 3 vessel brachiocephalic arch anatomy without proximal stenosis. Patchy aortic and coronary plaque. No pleural or pericardial effusion. No hilar or mediastinal adenopathy. Subcentimeter prevascular and precarinal lymph nodes. Emphysematous changes throughout both lungs. Patchy airspace disease in the superior segment right lower lobe has developed. There are smaller focal areas of airspace disease in the inferior right middle lobe and medial right lower lobe. Minimal spondylitic changes in the mid thoracic spine. Visualized portions of upper abdomen unremarkable.  Review of the MIP images confirms the above findings.  IMPRESSION: 1. Negative for acute PE or thoracic aortic dissection. 2. New patchy airspace disease in the right middle and lower lobes suggesting pneumonia. 3. Advanced COPD   Electronically Signed   By: Arne Cleveland M.D.   On: 02/26/2014 15:17    EKG: Independently reviewed. Sinus rhythm normal axis  Assessment/Plan Sepsis/ CAP (community acquired pneumonia)/ Acute respiratory failure with hypoxia/  COPD exacerbation - White blood cell count of 23, chest x-ray negative, the CT angiogram confirms right lower lobe infiltrates. I agree with continuing Rocephin and azithromycin. Getting blood cultures x2. I will give her a normal saline bolus as she has mild  lactic acidosis. And her blood pressures borderline. Will hold her blood pressure medications and continue normal saline at 125 an hour for the next 24 hours. - Admit her to telemetry Tylenol for fevers. - She's currently  wheezing we'll start her on oxygen and I agree with IV Solu-Medrol. Continue IV Timentin for the next few days.  AKI (acute kidney injury) - Prerenal due to sepsis. Her baseline creatinine is 1. We'll go ahead and give her IV fluids check a basic metabolic panel in the morning.    Code Status: full DVT Prophylaxis:Heparin Family Communication: husband Disposition Plan: inpatient  Time spent: 66 minutes  Charlynne Cousins [facility] Pager [phone]  **Disclaimer: This note may have been dictated with voice recognition software. Similar sounding words can inadvertently be transcribed and this note may contain transcription errors which may not have been corrected upon publication of note.**

## 2014-02-26 NOTE — ED Notes (Signed)
Bed: QR97 Expected date:  Expected time:  Means of arrival:  Comments: HOLD for triage 2

## 2014-02-26 NOTE — ED Notes (Signed)
I informed Sonia Baller, RN on 4W that patient has not received first dose of Rocephin or liter bolus ordered by Dr. Olevia Bowens.  Bolus not started because patient's BNP was elevated and I wanted to check with Dr. Olevia Bowens before giving.  Receiving RN acknowledged.

## 2014-02-26 NOTE — ED Notes (Signed)
Per pt, states lower extremity cramps and then became SOB that began last night

## 2014-02-26 NOTE — ED Notes (Signed)
Pt states last night she began having shortness of breath, cramping in her feet and legs and weakness.  Pt also c/o cough that is occasionally productive of white sputum.  Pt states when she rises to a standing position when rising to a standing position and states, "I was like a drunk person staggering all over the place."  Pt's lung sounds are clear - diminished right side.  No peripheral edema noted, good peripheral pulses in all 4 extremities.  Pt denies pain anywhere.  Pt's abdomen is soft and non-tender to palpation - bowel sounds present.  Pt is not on O2 at home.  Sats on 2 LNC in ED are 98%.  Pt states it feels as though she cannot inhale deeply.  Pt attempted pursed lip breathing last night and stated that helped "a little."

## 2014-02-26 NOTE — ED Provider Notes (Signed)
CSN: [id number]     Arrival date & time 02/26/14  1205 History   First MD Initiated Contact with Patient 02/26/14 1234     Chief Complaint  Patient presents with  . Shortness of Breath     (Consider location/radiation/quality/duration/timing/severity/associated sxs/prior Treatment) HPI Comments: Patient is a 68 year old female with history of COPD, hypertension, hyperlipidemia, sharp, carotid artery stenosis, hypothyroidism, GERD who presents to the emergency department today for shortness of breath. She reports that her shortness of breath with exertion worsening since yesterday. Last night she began to have cramping in both of her feet and legs. She feels generally weak. She feels lightheaded when she goes from sitting to standing. She has had pneumonia in the past approximately 6 months ago. She has not been hospitalized since that time. She continues to smoke over a pack a day.  The history is provided by the patient. No language interpreter was used.    Past Medical History  Diagnosis Date  . Genital warts   . Heart disease   . History of colon polyps   . Arthritis   . Vulva cancer 1975  . COPD (chronic obstructive pulmonary disease)   . Depression   . Emphysema of lung   . Hypertension   . Hyperlipidemia   . Multinodular goiter (nontoxic)   . 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 Ischemia, bowel   . Carotid artery stenosis     bilateral  . Hyperthyroidism     overactive thyroid   . Shortness of breath     with exertion   . GERD (gastroesophageal reflux disease)    Past Surgical History  Procedure Laterality Date  . Cholecystectomy    . Tubal ligation    . Appendectomy    . Carpal tunnel release    . Nose surgery    . Abdominal hysterectomy  1964  . Shoulder open rotator cuff repair  04/09/2012    Procedure: ROTATOR CUFF REPAIR SHOULDER OPEN;  Surgeon: Johnn Hai, MD;  Location: [facility];  Service: Orthopedics;  Laterality: Right;  Right Open Mini  Rotator Cuff Repair, Distal Clavicle Resection     Family History  Problem Relation Age of Onset  . Alcohol abuse Other   . Arthritis Other   . Heart disease Other   . Stroke Other   . Kidney disease Other   . Diabetes Other   . Hypertension Other   . Arthritis Other   . Heart disease Other   . Mental illness Other   . Diabetes Other   . Hypertension Other   . Alcohol abuse Other   . Hyperlipidemia Other   . Heart disease Other   . Hypertension Other   . Mental illness Other   . Diabetes Other   . Colon cancer Neg Hx   . Colon polyps Sister    History  Substance Use Topics  . Smoking status: Current Every Day Smoker -- 1.50 packs/day for 50 years  . Smokeless tobacco: Never Used  . Alcohol Use: Yes     Comment: rare   OB History   Grav Para Term Preterm Abortions TAB SAB Ect Mult Living                 Review of Systems  Constitutional: Negative for fever and chills.  Respiratory: Positive for cough, chest tightness and shortness of breath.   Gastrointestinal: Negative for nausea, vomiting and abdominal pain.  Neurological: Positive for weakness (  generalized).  All other systems reviewed and are negative.     Allergies  Review of patient's allergies indicates no known allergies.  Home Medications   Prior to Admission medications   Medication Sig Start Date End Date Taking? Authorizing Provider  ALPRAZolam (XANAX) 0.25 MG tablet Take 0.25 mg by mouth 2 (two) times daily as needed for anxiety.    Yes Historical Provider, MD  amLODipine (NORVASC) 10 MG tablet Take 10 mg by mouth at bedtime. 01/17/12  Yes Biagio Borg, MD  clopidogrel (PLAVIX) 75 MG tablet Take 1 tablet (75 mg total) by mouth daily. 04/07/12  Yes Biagio Borg, MD  FLUoxetine (PROZAC) 40 MG capsule Take 40 mg by mouth at bedtime. 01/17/12  Yes Biagio Borg, MD  HYDROcodone-acetaminophen ([place]) 7.5-325 MG per tablet Take 1 tablet by mouth every 6 (six) hours as needed for moderate pain.   Yes  Historical Provider, MD  levothyroxine (SYNTHROID, LEVOTHROID) 112 MCG tablet Take 112 mcg by mouth daily before breakfast.   Yes Historical Provider, MD  lisinopril (PRINIVIL,ZESTRIL) 20 MG tablet Take 20 mg by mouth at bedtime. 01/17/12  Yes Biagio Borg, MD  pantoprazole (PROTONIX) 40 MG tablet Take 40 mg by mouth daily.   Yes Historical Provider, MD  pramipexole (MIRAPEX) 0.25 MG tablet Take 0.25 mg by mouth every evening.   Yes Historical Provider, MD  tiotropium (SPIRIVA) 18 MCG inhalation capsule Place 18 mcg into inhaler and inhale daily.   Yes Historical Provider, MD  traZODone (DESYREL) 50 MG tablet Take 50 mg by mouth at bedtime.   Yes Historical Provider, MD   BP 115/58  Pulse 98  Temp(Src) 98.5 F (36.9 C) (Oral)  Resp 20  SpO2 100% Physical Exam  Nursing note and vitals reviewed. Constitutional: She is oriented to person, place, and time. She appears well-developed and well-nourished. No distress.  HENT:  Head: Normocephalic and atraumatic.  Right Ear: External ear normal.  Left Ear: External ear normal.  Nose: Nose normal.  Mouth/Throat: Oropharynx is clear and moist.  Eyes: Conjunctivae are normal.  Neck: Normal range of motion.  Cardiovascular: Normal rate, regular rhythm, normal heart sounds, intact distal pulses and normal pulses.   Pulses:      Radial pulses are 2+ on the right side, and 2+ on the left side.  Tenderness to palpation over right calf. No swelling or edema appreciated.   Pulmonary/Chest: No stridor. Tachypnea noted. She has decreased breath sounds in the right upper field, the right middle field and the right lower field. She has wheezes.  Patient can speak with a few words at a time  Abdominal: Soft. She exhibits no distension.  Musculoskeletal: Normal range of motion.  Neurological: She is alert and oriented to person, place, and time. She has normal strength.  Skin: Skin is warm and dry. She is not diaphoretic. No erythema.  Psychiatric: She has  a normal mood and affect. Her behavior is normal.    ED Course  Procedures (including critical care time) Labs Review Labs Reviewed  CBC - Abnormal; Notable for the following:    WBC 23.2 (*)    All other components within normal limits  BASIC METABOLIC PANEL - Abnormal; Notable for the following:    Glucose, Bld 116 (*)    Creatinine, Ser 1.32 (*)    GFR calc non Af Amer 41 (*)    GFR calc Af Amer 47 (*)    All other components within normal limits  PRO B  NATRIURETIC PEPTIDE - Abnormal; Notable for the following:    Pro B Natriuretic peptide (BNP) 254.9 (*)    All other components within normal limits  CULTURE, BLOOD (ROUTINE X 2)  CULTURE, BLOOD (ROUTINE X 2)  TROPONIN I  I-STAT CG4 LACTIC ACID, ED    Imaging Review Dg Chest 2 View  02/26/2014   CLINICAL DATA:  SHORTNESS OF BREATH  EXAM: CHEST - 2 VIEW  COMPARISON:  01/21/2014  FINDINGS: Mild hyperinflation with coarse patchy bibasilar interstitial markings. No confluent airspace disease or overt edema. Heart size upper limits normal. Mildly tortuous thoracic aorta. Regional bones unremarkable. Surgical clips in the upper abdomen. Orthopedic anchor in the right humeral head.  IMPRESSION: 1. Chronic interstitial changes without acute abnormality.   Electronically Signed   By: Arne Cleveland M.D.   On: 02/26/2014 13:30   Ct Head Wo Contrast  02/26/2014   CLINICAL DATA:  Severe headache  EXAM: CT HEAD WITHOUT CONTRAST  TECHNIQUE: Contiguous axial images were obtained from the base of the skull through the vertex without contrast.  COMPARISON:  08/15/2012  FINDINGS: Normal appearance of the intracranial structures. No evidence for acute hemorrhage, mass lesion, midline shift, hydrocephalus or large infarct. No acute bony abnormality. The visualized sinuses are clear.  IMPRESSION: No acute intracranial abnormality.   Electronically Signed   By: Daryll Brod M.D.   On: 02/26/2014 15:13   Ct Angio Chest Pe W/cm &/or Wo Cm  02/26/2014    CLINICAL DATA:  r/o pe  EXAM: CT ANGIOGRAPHY CHEST WITH CONTRAST  TECHNIQUE: Multidetector CT imaging of the chest was performed using the standard protocol during bolus administration of intravenous contrast. Multiplanar CT image reconstructions and MIPs were obtained to evaluate the vascular anatomy.  CONTRAST:  59mL OMNIPAQUE IOHEXOL 350 MG/ML SOLN  COMPARISON:  01/16/2014  FINDINGS: Satisfactory opacification of pulmonary arteries noted, and there is no evidence of pulmonary emboli. Adequate contrast opacification of the thoracic aorta with no evidence of dissection, aneurysm, or stenosis. There is classic 3 vessel brachiocephalic arch anatomy without proximal stenosis. Patchy aortic and coronary plaque. No pleural or pericardial effusion. No hilar or mediastinal adenopathy. Subcentimeter prevascular and precarinal lymph nodes. Emphysematous changes throughout both lungs. Patchy airspace disease in the superior segment right lower lobe has developed. There are smaller focal areas of airspace disease in the inferior right middle lobe and medial right lower lobe. Minimal spondylitic changes in the mid thoracic spine. Visualized portions of upper abdomen unremarkable.  Review of the MIP images confirms the above findings.  IMPRESSION: 1. Negative for acute PE or thoracic aortic dissection. 2. New patchy airspace disease in the right middle and lower lobes suggesting pneumonia. 3. Advanced COPD   Electronically Signed   By: Arne Cleveland M.D.   On: 02/26/2014 15:17     EKG Interpretation None      MDM   Final diagnoses:  CAP (community acquired pneumonia)    Patient presents to ED with CAP. Shortness of breath, increased oxygen demand, tachypnea. Initially infiltrate was not seen on CXR and CT angio was done to rule out PE. CT shows patchy airspace disease suggestive of pneumonia. Patient started on azithromycin and rocephin. Patient feels slightly improved after 2 breathing treatments. Patient is  hemodynamically stable. Admitted to medicine for further management. Admission is appreciated. Dr. Wilson Singer evaluated patient and agrees with plan.     Elwyn Lade, PA-C 02/27/14 [phone]

## 2014-02-26 NOTE — Progress Notes (Signed)
VASCULAR LAB PRELIMINARY  PRELIMINARY  PRELIMINARY  PRELIMINARY  Right lower extremity venous duplex completed.    Preliminary report:  Right:  No evidence of DVT, superficial thrombosis, or Baker's cyst.  Jessamyn Watterson, RVT 02/26/2014, 4:01 PM

## 2014-02-27 LAB — CBC
HCT: 32.3 % — ABNORMAL LOW (ref 36.0–46.0)
Hemoglobin: 10.6 g/dL — ABNORMAL LOW (ref 12.0–15.0)
MCH: 28.4 pg (ref 26.0–34.0)
MCHC: 32.8 g/dL (ref 30.0–36.0)
MCV: 86.6 fL (ref 78.0–100.0)
PLATELETS: 324 10*3/uL (ref 150–400)
RBC: 3.73 MIL/uL — AB (ref 3.87–5.11)
RDW: 14.6 % (ref 11.5–15.5)
WBC: 22.9 10*3/uL — AB (ref 4.0–10.5)

## 2014-02-27 LAB — COMPREHENSIVE METABOLIC PANEL
ALT: 6 U/L (ref 0–35)
AST: 8 U/L (ref 0–37)
Albumin: 2.9 g/dL — ABNORMAL LOW (ref 3.5–5.2)
Alkaline Phosphatase: 91 U/L (ref 39–117)
Anion gap: 15 (ref 5–15)
BUN: 20 mg/dL (ref 6–23)
CALCIUM: 8.9 mg/dL (ref 8.4–10.5)
CO2: 21 mEq/L (ref 19–32)
CREATININE: 1.31 mg/dL — AB (ref 0.50–1.10)
Chloride: 101 mEq/L (ref 96–112)
GFR calc non Af Amer: 41 mL/min — ABNORMAL LOW (ref >90)
GFR, EST AFRICAN AMERICAN: 48 mL/min — AB (ref >90)
GLUCOSE: 152 mg/dL — AB (ref 70–99)
Potassium: 4.1 mEq/L (ref 3.7–5.3)
Sodium: 137 mEq/L (ref 137–147)
Total Bilirubin: <0.2 mg/dL — ABNORMAL LOW (ref 0.3–1.2)
Total Protein: 6 g/dL (ref 6.0–8.3)

## 2014-02-27 LAB — HIV ANTIBODY (ROUTINE TESTING W REFLEX): HIV: NONREACTIVE

## 2014-02-27 LAB — EXPECTORATED SPUTUM ASSESSMENT W GRAM STAIN, RFLX TO RESP C

## 2014-02-27 LAB — LEGIONELLA ANTIGEN, URINE: Legionella Antigen, Urine: NEGATIVE

## 2014-02-27 LAB — EXPECTORATED SPUTUM ASSESSMENT W REFEX TO RESP CULTURE

## 2014-02-27 LAB — STREP PNEUMONIAE URINARY ANTIGEN: Strep Pneumo Urinary Antigen: NEGATIVE

## 2014-02-27 MED ORDER — ALBUTEROL SULFATE (2.5 MG/3ML) 0.083% IN NEBU
2.5000 mg | INHALATION_SOLUTION | Freq: Four times a day (QID) | RESPIRATORY_TRACT | Status: DC
  Administered 2014-02-27 – 2014-03-01 (×7): 2.5 mg via RESPIRATORY_TRACT
  Filled 2014-02-27 (×7): qty 3

## 2014-02-27 MED ORDER — TRIAMCINOLONE ACETONIDE 0.5 % EX CREA
TOPICAL_CREAM | Freq: Three times a day (TID) | CUTANEOUS | Status: DC
Start: 2014-02-27 — End: 2014-03-01
  Administered 2014-02-27 – 2014-02-28 (×4): via TOPICAL
  Administered 2014-02-28: 1 via TOPICAL
  Filled 2014-02-27: qty 15

## 2014-02-27 MED ORDER — ALBUTEROL SULFATE (2.5 MG/3ML) 0.083% IN NEBU
2.5000 mg | INHALATION_SOLUTION | RESPIRATORY_TRACT | Status: DC | PRN
  Administered 2014-03-01: 2.5 mg via RESPIRATORY_TRACT
  Filled 2014-02-27: qty 3

## 2014-02-27 NOTE — Progress Notes (Signed)
Patient ID: Cheryl Mcguire, female   DOB: 10-30-1945, 68 y.o.   MRN: [id number]  [facility] PROGRESS NOTE  SHEILA OCASIO YQI:[id number] DOB: 1945-09-09 DOA: 02/26/2014 PCP: Cathlean Cower, MD  Brief narrative: 68 y.o. Female with history of tobacco abuse, COPD non-oxygen at home, CVA and hypertension, presented to [facility] ED with main concern of progressively worsening shortness of breath that started several days PTA and much worse over the past 24 hours. Worse with exertion and associated with fevers, chills, productive cough of yellow sputum.   In the ED: A CBC was done that shows a white count of 23 CT chest to rule out a PE showed an infiltrate as below.   Principal Problem:   Sepsis - secondary to CAP - stable this AM - ABX as noted above and IVF  - WBC still elevated and also likely from steroid imposed on an acute infection  Active Problems:   CAP (community acquired pneumonia) - management with Zithromax and Rocephin - sputum culture and urine legionella and strep pneumo pending  - oxygen via [place] for now    Acute blood loss anemia - likely dilutional from IVF - no signs of active bleeding - repeat CBC in AM   AKI (acute kidney injury) - possibly pre renal from poor oral intake an dehydration - continue IVF and repeat BMP in AM   Acute respiratory failure with hypoxia - secondary to CAP imposed on acute on chronic COPD - ABX and steroids as noted above, BD's    COPD exacerbation - clinically stable this AM - maintaining oxygen saturation at target range on 2 L [place] - continue solumedrol due to mild wheezing and plan to taper down as clinically indicted - continue BD's scheduled and as needed - ABX as noted above    DVT prophylaxis: - heparin SQ  Consultants:  None   Procedures/Studies: Dg Chest 2 View  02/26/2014  Chronic interstitial changes without acute abnormality.    Ct Head Wo Contrast  02/26/2014  No acute intracranial abnormality.    02/26/2014 15:13  Ct  Angio Chest Pe W/cm &/or Wo Cm  02/26/2014  Negative for acute PE or thoracic aortic dissection. New patchy airspace disease in the right middle and lower lobes suggesting pneumonia. Advanced COPD     Antibiotics:  Zithromax 7/24 -->  Rocephin 7/24 -->   Code Status: Full Family Communication: Pt at bedside Disposition Plan: Home when medically stable  HPI/Subjective: No events overnight.   Objective: Filed Vitals:   02/26/14 1726 02/26/14 2155 02/27/14 0447 02/27/14 1124  BP: 119/46 106/46 123/58   Pulse: 94 81 88   Temp: 98.3 F (36.8 C) 98.2 F (36.8 C) 97.5 F (36.4 C)   TempSrc: Oral Oral Oral   Resp: 20 22 24    Height: 5\' 1"  (1.549 m)     Weight: 57.834 kg (127 lb 8 oz)     SpO2: 100% 100% 99% 99%    Intake/Output Summary (Last 24 hours) at 02/27/14 1314 Last data filed at 02/27/14 0941  Gross per 24 hour  Intake 1826.25 ml  Output    600 ml  Net 1226.25 ml    Exam:   General:  Pt is alert, follows commands appropriately, not in acute distress  Cardiovascular: Regular rate and rhythm, S1/S2, no murmurs, no rubs, no gallops  Respiratory: Mild exp wheezing with rhonchi R> L side   Abdomen: Soft, non tender, non distended, bowel sounds present, no guarding  Extremities: No edema,  pulses DP and PT palpable bilaterally  Neuro: Grossly nonfocal  Data Reviewed: Basic Metabolic Panel:  Recent Labs Lab 02/26/14 1243 02/26/14 1815 02/27/14 0500  NA 139  --  137  K 4.2  --  4.1  CL 103  --  101  CO2 22  --  21  GLUCOSE 116*  --  152*  BUN 15  --  20  CREATININE 1.32* 1.36* 1.31*  CALCIUM 9.3  --  8.9   Liver Function Tests:  Recent Labs Lab 02/27/14 0500  AST 8  ALT 6  ALKPHOS 91  BILITOT <0.2*  PROT 6.0  ALBUMIN 2.9*   CBC:  Recent Labs Lab 02/26/14 1243 02/26/14 1815 02/27/14 0500  WBC 23.2* 20.2* 22.9*  HGB 12.0 10.8* 10.6*  HCT 36.5 32.5* 32.3*  MCV 87.3 87.1 86.6  PLT 300 296 324   Cardiac Enzymes:  Recent Labs Lab  02/26/14 1243  TROPONINI <0.30     Recent Results (from the past 240 hour(s))  CULTURE, EXPECTORATED SPUTUM-ASSESSMENT     Status: None   Collection Time    02/27/14  9:12 AM      Result Value Ref Range Status   Specimen Description SPUTUM   Final   Special Requests NONE   Final   Sputum evaluation     Final   Value: MICROSCOPIC FINDINGS SUGGEST THAT THIS SPECIMEN IS NOT REPRESENTATIVE OF LOWER RESPIRATORY SECRETIONS. PLEASE RECOLLECT.     SPOKE WITH STEWART,S RN @ [phone] BY HOOKER,B   Report Status 02/27/2014 FINAL   Final     Scheduled Meds: . azithromycin  500 mg Oral Q24H  . cefTRIAXone (ROCEPHIN)  IV  1 g Intravenous Q24H  . clopidogrel  75 mg Oral Daily  . FLUoxetine  40 mg Oral QHS  . heparin  5,000 Units Subcutaneous 3 times per day  . levothyroxine  112 mcg Oral QAC breakfast  . methylPREDNISolone (SOLU-MEDROL) injection  60 mg Intravenous Q12H  . nicotine  21 mg Transdermal Daily  . pantoprazole  40 mg Oral Daily  . pramipexole  0.25 mg Oral QPM  . sodium chloride  1,000 mL Intravenous Once  . sodium chloride  3 mL Intravenous Q12H  . tiotropium  18 mcg Inhalation Daily  . traZODone  50 mg Oral QHS  . triamcinolone cream   Topical TID   Continuous Infusions: . sodium chloride 75 mL (02/26/14 1731)     Faye Ramsay, MD  [place] Pager [phone]  If 7PM-7AM, please contact night-coverage [email or web address] Password [email or web address] 02/27/2014, 1:14 PM   LOS: 1 day

## 2014-02-28 ENCOUNTER — Other Ambulatory Visit (HOSPITAL_COMMUNITY): Payer: PRIVATE HEALTH INSURANCE

## 2014-02-28 ENCOUNTER — Encounter (HOSPITAL_COMMUNITY): Payer: Self-pay | Admitting: Radiology

## 2014-02-28 ENCOUNTER — Inpatient Hospital Stay (HOSPITAL_COMMUNITY): Payer: PRIVATE HEALTH INSURANCE

## 2014-02-28 DIAGNOSIS — D72829 Elevated white blood cell count, unspecified: Secondary | ICD-10-CM | POA: Diagnosis present

## 2014-02-28 DIAGNOSIS — F17200 Nicotine dependence, unspecified, uncomplicated: Secondary | ICD-10-CM

## 2014-02-28 LAB — BASIC METABOLIC PANEL
Anion gap: 12 (ref 5–15)
BUN: 18 mg/dL (ref 6–23)
CHLORIDE: 103 meq/L (ref 96–112)
CO2: 23 mEq/L (ref 19–32)
Calcium: 8.9 mg/dL (ref 8.4–10.5)
Creatinine, Ser: 1.21 mg/dL — ABNORMAL HIGH (ref 0.50–1.10)
GFR calc non Af Amer: 45 mL/min — ABNORMAL LOW (ref >90)
GFR, EST AFRICAN AMERICAN: 52 mL/min — AB (ref >90)
Glucose, Bld: 138 mg/dL — ABNORMAL HIGH (ref 70–99)
POTASSIUM: 5 meq/L (ref 3.7–5.3)
Sodium: 138 mEq/L (ref 137–147)

## 2014-02-28 LAB — CBC
HEMATOCRIT: 31.1 % — AB (ref 36.0–46.0)
Hemoglobin: 10.1 g/dL — ABNORMAL LOW (ref 12.0–15.0)
MCH: 28.4 pg (ref 26.0–34.0)
MCHC: 32.5 g/dL (ref 30.0–36.0)
MCV: 87.4 fL (ref 78.0–100.0)
Platelets: 304 10*3/uL (ref 150–400)
RBC: 3.56 MIL/uL — ABNORMAL LOW (ref 3.87–5.11)
RDW: 14.6 % (ref 11.5–15.5)
WBC: 30.3 10*3/uL — ABNORMAL HIGH (ref 4.0–10.5)

## 2014-02-28 LAB — IRON AND TIBC
Iron: 21 ug/dL — ABNORMAL LOW (ref 42–135)
SATURATION RATIOS: 8 % — AB (ref 20–55)
TIBC: 273 ug/dL (ref 250–470)
UIBC: 252 ug/dL (ref 125–400)

## 2014-02-28 LAB — TSH: TSH: 0.183 u[IU]/mL — ABNORMAL LOW (ref 0.350–4.500)

## 2014-02-28 LAB — VITAMIN B12: Vitamin B-12: 352 pg/mL (ref 211–911)

## 2014-02-28 MED ORDER — LEVOTHYROXINE SODIUM 100 MCG PO TABS
100.0000 ug | ORAL_TABLET | Freq: Every day | ORAL | Status: DC
  Administered 2014-03-01: 100 ug via ORAL
  Filled 2014-02-28 (×2): qty 1

## 2014-02-28 MED ORDER — IOHEXOL 300 MG/ML  SOLN
100.0000 mL | Freq: Once | INTRAMUSCULAR | Status: AC | PRN
  Administered 2014-02-28: 100 mL via INTRAVENOUS

## 2014-02-28 MED ORDER — IOHEXOL 300 MG/ML  SOLN
25.0000 mL | INTRAMUSCULAR | Status: AC
  Administered 2014-02-28 (×2): 25 mL via ORAL

## 2014-02-28 MED ORDER — PREDNISONE 50 MG PO TABS
50.0000 mg | ORAL_TABLET | Freq: Every day | ORAL | Status: DC
  Administered 2014-03-01: 50 mg via ORAL
  Filled 2014-02-28 (×2): qty 1

## 2014-02-28 NOTE — Evaluation (Signed)
Physical Therapy Evaluation Patient Details Name: Cheryl Mcguire MRN: [id number] DOB: 1946/03/22 Today's Date: 02/28/2014   History of Present Illness  68 yo female admitted with sepsis, weakness, Pna, COPD exac. Hx of COPD, HTN, CVA, emphysema, R rotator cuff repair 2013  Clinical Impression  On eval, pt required Min assist for mobility-able to ambulate ~140 feet with cane. Unsteady at times. Dyspnea 2/4.     Follow Up Recommendations Home health PT (depending on progress)    Equipment Recommendations  None recommended by PT    Recommendations for Other Services OT consult     Precautions / Restrictions Precautions Precautions: Fall Restrictions Weight Bearing Restrictions: No      Mobility  Bed Mobility Overal bed mobility: Modified Independent                Transfers Overall transfer level: Needs assistance Equipment used: Straight cane Transfers: Sit to/from Stand Sit to Stand: Supervision            Ambulation/Gait Ambulation/Gait assistance: Min assist Ambulation Distance (Feet): 140 Feet Assistive device: Straight cane Gait Pattern/deviations: Step-through pattern;Decreased stride length     General Gait Details: unsteady at times. mostly close guard assist however pt required Min assist intermittently. dyspnea 2/4.   Stairs            Wheelchair Mobility    Modified Rankin (Stroke Patients Only)       Balance                                             Pertinent Vitals/Pain Pt denied pain    Home Living Family/patient expects to be discharged to::  (pt states she is trying to find an apt...) Living Arrangements: Spouse/significant other (divorced)   Type of Home: Mobile home Home Access: Stairs to enter Entrance Stairs-Rails: None Entrance Stairs-Number of Steps: 1+1 Home Layout: One level Home Equipment: Cane - single point      Prior Function Level of Independence: Independent                Hand Dominance        Extremity/Trunk Assessment   Upper Extremity Assessment: Generalized weakness           Lower Extremity Assessment: Generalized weakness      Cervical / Trunk Assessment: Kyphotic  Communication   Communication: No difficulties  Cognition Arousal/Alertness: Awake/alert Behavior During Therapy: WFL for tasks assessed/performed Overall Cognitive Status: Within Functional Limits for tasks assessed                      General Comments      Exercises        Assessment/Plan    PT Assessment Patient needs continued PT services  PT Diagnosis Difficulty walking;Generalized weakness   PT Problem List Decreased strength;Decreased activity tolerance;Decreased balance;Decreased mobility  PT Treatment Interventions DME instruction;Gait training;Functional mobility training;Therapeutic activities;Therapeutic exercise;Patient/family education;Balance training   PT Goals (Current goals can be found in the Care Plan section) Acute Rehab PT Goals Patient Stated Goal: home soon. regain PLOF PT Goal Formulation: With patient Time For Goal Achievement: 03/14/14 Potential to Achieve Goals: Good    Frequency Min 3X/week   Barriers to discharge        Co-evaluation               End of Session Equipment  Utilized During Treatment: Gait belt Activity Tolerance: Patient tolerated treatment well Patient left: in bed;with call bell/phone within reach           Time: 1507-1525 PT Time Calculation (min): 18 min   Charges:   PT Evaluation $Initial PT Evaluation Tier I: 1 Procedure PT Treatments $Gait Training: 8-22 mins   PT G Codes:          Weston Anna, MPT Pager: [phone]

## 2014-02-28 NOTE — Progress Notes (Signed)
[facility] PROGRESS NOTE  Cheryl Mcguire:[id number] DOB: [birth year] DOA: 02/26/2014 PCP: Cathlean Cower, MD Brief narrative 68 year old female with history of ongoing tobacco use, COPD not on oxygen at home, history of CVA and hypertension presented to [facility] long ED with progressive shortness of breath for past several days worsened one day prior to hospitalization. Patient also reports associated fevers or chills and productive cough with yellowish phlegm. In the ED patient was found to have WBC of 23,000 and a CT angiogram of chest done to rule out PE showed a right middle and lower lobe infiltrate suggestive of pneumonia.    Assessment/Plan: Sepsis with it acute hypoxic respiratory failure Secondary to community-acquired pneumonia and COPD exacerbation. On empiric IV Rocephin and azithromycin. Cultures so far negative. Patient has significant progressive leukocytosis.   Progressive leukocytosis Possibly in the setting of underlying pneumonia and IV steroid use. Patient remains afebrile and respiratory failure clinically improving. I will plan a CT scan of the abdomen and pelvis given complaint of abdominal pain we'll rule out for intra-abdominal infection or abscess. CT scan of the chest done on admission shows pneumonia.  COPD exacerbation Will cognition IV Solu-Medrol to oral prednisone. Continue scheduled  Nebulizer  and inhalers. Monitor O2 sat. She was counseled strongly on smoking cessation. Continue nicotine patch. Patient reports taking Chantix, thickened gums and patches in the past but was unable to quit smoking. Will provide contact info on quitline [place].  Acute kidney injury Present on admission and now improving with IV fluids. Possibly prerenal due to poor by mouth intake  Anemia Iron deficiency noted in labs. Normal Vitamin B12 level. Check stool for occult blood  Hypothyroidism Markedly suppressed TSH Will reduce Synthroid dose.  Anxiety and depression Continue  with when necessary Xanax, Prozac and trazodone.  History of CVA Continue with Plavix.  Generalized weakness Physical therapy evaluation    Diet: Regular DVT prophylaxis : subcutaneous heparin   Code Status: Full code Family Communication: None at Bedside Disposition Plan: Home once clinically improved   Consultants:  None  Procedures:  None  Antibiotics:  IV Rocephin and azithromycin since 7/25  HPI/Subjective: Patient seen and examined this morning. Reports her breathing to be better.  Objective: Filed Vitals:   02/28/14 1448  BP: 141/64  Pulse: 88  Temp: 97.3 F (36.3 C)  Resp: 18    Intake/Output Summary (Last 24 hours) at 02/28/14 1533 Last data filed at 02/28/14 1449  Gross per 24 hour  Intake   2423 ml  Output   1950 ml  Net    473 ml   Filed Weights   02/26/14 1726  Weight: 57.834 kg (127 lb 8 oz)    Exam:   General:  Elderly female in no acute distress, appears anxious  HEENT: Pallor, moist oral mucosa  Chest: Scattered rhonchi and wheeze bilaterally  CVS: Normal S1 and S2, no murmurs rub or gallop  Abdomen: Soft, nontender, nondistended, bowel sounds present  Extremities: Warm, no edema  CNS: Alert and oriented  Data Reviewed: Basic Metabolic Panel:  Recent Labs Lab 02/26/14 1243 02/26/14 1815 02/27/14 0500 02/28/14 0038  NA 139  --  137 138  K 4.2  --  4.1 5.0  CL 103  --  101 103  CO2 22  --  21 23  GLUCOSE 116*  --  152* 138*  BUN 15  --  20 18  CREATININE 1.32* 1.36* 1.31* 1.21*  CALCIUM 9.3  --  8.9 8.9   Liver  Function Tests:  Recent Labs Lab 02/27/14 0500  AST 8  ALT 6  ALKPHOS 91  BILITOT <0.2*  PROT 6.0  ALBUMIN 2.9*   No results found for this basename: LIPASE, AMYLASE,  in the last 168 hours No results found for this basename: AMMONIA,  in the last 168 hours CBC:  Recent Labs Lab 02/26/14 1243 02/26/14 1815 02/27/14 0500 02/28/14 0038  WBC 23.2* 20.2* 22.9* 30.3*  HGB 12.0 10.8*  10.6* 10.1*  HCT 36.5 32.5* 32.3* 31.1*  MCV 87.3 87.1 86.6 87.4  PLT 300 296 324 304   Cardiac Enzymes:  Recent Labs Lab 02/26/14 1243  TROPONINI <0.30   BNP (last 3 results)  Recent Labs  02/26/14 1243  PROBNP 254.9*   CBG: No results found for this basename: GLUCAP,  in the last 168 hours  Recent Results (from the past 240 hour(s))  CULTURE, BLOOD (ROUTINE X 2)     Status: None   Collection Time    02/26/14  3:56 PM      Result Value Ref Range Status   Specimen Description BLOOD LEFT ARM   Final   Special Requests BOTTLES DRAWN AEROBIC AND ANAEROBIC 3CC   Final   Culture  Setup Time     Final   Value: 02/26/2014 19:57     Performed at [facility]   Culture     Final   Value:        BLOOD CULTURE RECEIVED NO GROWTH TO DATE CULTURE WILL BE HELD FOR 5 DAYS BEFORE ISSUING A FINAL NEGATIVE REPORT     Performed at [facility]   Report Status PENDING   Incomplete  CULTURE, BLOOD (ROUTINE X 2)     Status: None   Collection Time    02/26/14  3:57 PM      Result Value Ref Range Status   Specimen Description BLOOD LEFT ANTECUBITAL   Final   Special Requests BOTTLES DRAWN AEROBIC AND ANAEROBIC 3CC   Final   Culture  Setup Time     Final   Value: 02/26/2014 19:57     Performed at [facility]   Culture     Final   Value:        BLOOD CULTURE RECEIVED NO GROWTH TO DATE CULTURE WILL BE HELD FOR 5 DAYS BEFORE ISSUING A FINAL NEGATIVE REPORT     Performed at [facility]   Report Status PENDING   Incomplete  CULTURE, EXPECTORATED SPUTUM-ASSESSMENT     Status: None   Collection Time    02/27/14  9:12 AM      Result Value Ref Range Status   Specimen Description SPUTUM   Final   Special Requests NONE   Final   Sputum evaluation     Final   Value: MICROSCOPIC FINDINGS SUGGEST THAT THIS SPECIMEN IS NOT REPRESENTATIVE OF LOWER RESPIRATORY SECRETIONS. PLEASE RECOLLECT.     SPOKE WITH STEWART,S RN @ [phone] BY HOOKER,B   Report Status  02/27/2014 FINAL   Final     Studies: Ct Abdomen Pelvis W Contrast  02/28/2014   CLINICAL DATA:  Left lower quadrant pain with elevated white blood cell count; history of bladder malignancy and hysterectomy, cholecystectomy, and appendectomy.  EXAM: CT ABDOMEN AND PELVIS WITH CONTRAST  TECHNIQUE: Multidetector CT imaging of the abdomen and pelvis was performed using the standard protocol following bolus administration of intravenous contrast.  CONTRAST:  134mL OMNIPAQUE IOHEXOL 300 MG/ML SOLN intravenously. The patient also received  oral contrast material  COMPARISON:  CT scan of the abdomen pelvis of January 21, 2014  FINDINGS: The gallbladder is surgically absent. The liver, pancreas, spleen, nondistended stomach, adrenal glands, and kidneys exhibit no acute abnormalities. There are stable cortically based hypodensities in both kidneys compatible with cysts. The largest lies on the left and is exophytic from the anterior aspect of the midpole. It measures 3.2 cm in greatest dimension. The urinary bladder is unremarkable. The uterus is surgically absent. There is a tiny amount of free pelvic fluid.  The oral contrast has traversed the normal appearing small bowel and reached as far distally in the colon as the splenic flexure. A moderate stool burden is present within the ascending and transverse portions of the colon. The descending and rectosigmoid portions contain a normal stool and gas volume. There are scattered diverticula without evidence of diverticulitis. There is no intra-abdominal abscess nor lymphadenopathy. The psoas musculature is normal. There is no inguinal nor umbilical hernia.  The lung bases exhibit emphysematous changes and minimal scarring. The observed bony structures are unremarkable. There is mild annular disc bulging at L5-S1.  IMPRESSION: 1. No acute or chronic intra-abdominal or pelvic inflammatory process is demonstrated. There is no acute abnormality otherwise. There are stable  hypodensities in both kidneys consistent with cysts. 2. There are emphysematous changes in both lungs and minimal scarring at the lung bases.   Electronically Signed   By: David  Martinique   On: 02/28/2014 14:52    Scheduled Meds: . albuterol  2.5 mg Nebulization QID  . azithromycin  500 mg Oral Q24H  . cefTRIAXone (ROCEPHIN)  IV  1 g Intravenous Q24H  . clopidogrel  75 mg Oral Daily  . FLUoxetine  40 mg Oral QHS  . heparin  5,000 Units Subcutaneous 3 times per day  . levothyroxine  112 mcg Oral QAC breakfast  . methylPREDNISolone (SOLU-MEDROL) injection  60 mg Intravenous Q12H  . nicotine  21 mg Transdermal Daily  . pantoprazole  40 mg Oral Daily  . pramipexole  0.25 mg Oral QPM  . sodium chloride  1,000 mL Intravenous Once  . sodium chloride  3 mL Intravenous Q12H  . tiotropium  18 mcg Inhalation Daily  . traZODone  50 mg Oral QHS  . triamcinolone cream   Topical TID   Continuous Infusions:     Time spent: 25 minutes    Juliann Olesky, [place]  [facility] Pager [phone]. If 7PM-7AM, please contact night-coverage at [email or web address], password [facility] 02/28/2014, 3:33 PM  LOS: 2 days

## 2014-02-28 NOTE — Progress Notes (Signed)
Nutrition Brief Note  Patient identified on the Malnutrition Screening Tool (MST) Report  Wt Readings from Last 15 Encounters:  02/26/14 127 lb 8 oz (57.834 kg)  05/28/12 119 lb 6 oz (54.148 kg)  04/15/12 123 lb 12.8 oz (56.155 kg)  04/09/12 122 lb 9.6 oz (55.611 kg)  04/09/12 122 lb 9.6 oz (55.611 kg)  04/01/12 122 lb 9.6 oz (55.611 kg)  03/25/12 122 lb (55.339 kg)  03/20/12 122 lb 8 oz (55.566 kg)  03/12/12 122 lb 1.9 oz (55.393 kg)  03/05/12 125 lb (56.7 kg)  02/14/12 125 lb (56.7 kg)  02/11/12 125 lb (56.7 kg)  01/17/12 125 lb (56.7 kg)    Body mass index is 24.1 kg/(m^2). Patient meets criteria for Normal weight based on current BMI.   Current diet order is Heart Healthy, patient is consuming approximately 75-100% of meals at this time. Labs and medications reviewed.   Pt denied any changes in weight or appetite pta. Pt does not enjoy heart healthy diet restrictions but understands their importance and is eating well during admit. No further questions regarding heart healthy diet  No nutrition interventions warranted at this time. If nutrition issues arise, please consult RD.   Atlee Abide MS RD LDN Clinical Dietitian [phone]

## 2014-03-01 DIAGNOSIS — I10 Essential (primary) hypertension: Secondary | ICD-10-CM

## 2014-03-01 DIAGNOSIS — E039 Hypothyroidism, unspecified: Secondary | ICD-10-CM | POA: Diagnosis present

## 2014-03-01 LAB — BASIC METABOLIC PANEL
Anion gap: 12 (ref 5–15)
BUN: 15 mg/dL (ref 6–23)
CO2: 28 mEq/L (ref 19–32)
Calcium: 9.1 mg/dL (ref 8.4–10.5)
Chloride: 100 mEq/L (ref 96–112)
Creatinine, Ser: 1.09 mg/dL (ref 0.50–1.10)
GFR calc Af Amer: 59 mL/min — ABNORMAL LOW (ref >90)
GFR calc non Af Amer: 51 mL/min — ABNORMAL LOW (ref >90)
GLUCOSE: 100 mg/dL — AB (ref 70–99)
Potassium: 3.7 mEq/L (ref 3.7–5.3)
Sodium: 140 mEq/L (ref 137–147)

## 2014-03-01 LAB — CBC WITH DIFFERENTIAL/PLATELET
Basophils Absolute: 0 10*3/uL (ref 0.0–0.1)
Basophils Relative: 0 % (ref 0–1)
Eosinophils Absolute: 0 10*3/uL (ref 0.0–0.7)
Eosinophils Relative: 0 % (ref 0–5)
HCT: 33.1 % — ABNORMAL LOW (ref 36.0–46.0)
Hemoglobin: 10.7 g/dL — ABNORMAL LOW (ref 12.0–15.0)
LYMPHS PCT: 11 % — AB (ref 12–46)
Lymphs Abs: 2.3 10*3/uL (ref 0.7–4.0)
MCH: 28.5 pg (ref 26.0–34.0)
MCHC: 32.3 g/dL (ref 30.0–36.0)
MCV: 88.3 fL (ref 78.0–100.0)
Monocytes Absolute: 0.8 10*3/uL (ref 0.1–1.0)
Monocytes Relative: 4 % (ref 3–12)
NEUTROS PCT: 85 % — AB (ref 43–77)
Neutro Abs: 18.4 10*3/uL — ABNORMAL HIGH (ref 1.7–7.7)
Platelets: 298 10*3/uL (ref 150–400)
RBC: 3.75 MIL/uL — AB (ref 3.87–5.11)
RDW: 14.9 % (ref 11.5–15.5)
WBC: 21.5 10*3/uL — ABNORMAL HIGH (ref 4.0–10.5)

## 2014-03-01 MED ORDER — TIOTROPIUM BROMIDE MONOHYDRATE 18 MCG IN CAPS: 18.0000 ug | ORAL_CAPSULE | Freq: Every day | RESPIRATORY_TRACT | Status: DC

## 2014-03-01 MED ORDER — TRIAMCINOLONE ACETONIDE 0.5 % EX CREA: TOPICAL_CREAM | Freq: Three times a day (TID) | CUTANEOUS | Status: DC

## 2014-03-01 MED ORDER — NICOTINE 21 MG/24HR TD PT24: 21.0000 mg | MEDICATED_PATCH | Freq: Every day | TRANSDERMAL | Status: DC

## 2014-03-01 MED ORDER — ALBUTEROL SULFATE (2.5 MG/3ML) 0.083% IN NEBU: 2.5000 mg | INHALATION_SOLUTION | Freq: Four times a day (QID) | RESPIRATORY_TRACT | Status: DC | PRN

## 2014-03-01 MED ORDER — LEVOFLOXACIN 750 MG PO TABS: 750.0000 mg | ORAL_TABLET | Freq: Every day | ORAL | Status: AC

## 2014-03-01 MED ORDER — LEVOTHYROXINE SODIUM 100 MCG PO TABS: 100.0000 ug | ORAL_TABLET | Freq: Every day | ORAL | Status: DC

## 2014-03-01 MED ORDER — MOMETASONE FURO-FORMOTEROL FUM 200-5 MCG/ACT IN AERO: 2.0000 | INHALATION_SPRAY | Freq: Two times a day (BID) | RESPIRATORY_TRACT | Status: DC

## 2014-03-01 MED ORDER — ALBUTEROL SULFATE HFA 108 (90 BASE) MCG/ACT IN AERS: 2.0000 | INHALATION_SPRAY | Freq: Four times a day (QID) | RESPIRATORY_TRACT | Status: DC | PRN

## 2014-03-01 MED ORDER — PREDNISONE 20 MG PO TABS
20.0000 mg | ORAL_TABLET | Freq: Every day | ORAL | Status: DC
Start: 2014-03-01 — End: 2023-06-27

## 2014-03-01 NOTE — Progress Notes (Signed)
Came to visit patient to offer [facility] services. However patient already discharged. Noted patient declined home health.  Marthenia Rolling, MSN- [facility] [phone]

## 2014-03-01 NOTE — Discharge Summary (Signed)
Physician Discharge Summary  Cheryl Mcguire EXB:[id number] DOB: 03-26-1946 DOA: 02/26/2014  PCP: Cathlean Cower, MD  Admit date: 02/26/2014 Discharge date: 03/01/2014  Time spent: 35 minutes  Recommendations for Outpatient Follow-up:  1. discharge home with HHPT 2. Follow up with PCP in 1 week and check cbc.   Discharge Diagnoses:  Principal Problem:   Acute respiratory failure with hypoxia  Active Problems:   CAP (community acquired pneumonia)   Sepsis   Depression   Anxiety   AKI (acute kidney injury)   COPD exacerbation   Leucocytosis   Essential hypertension   Unspecified hypothyroidism   Discharge Condition: FAIR  Diet recommendation: low sodium  Filed Weights   02/26/14 1726  Weight: 57.834 kg (127 lb 8 oz)    History of present illness:  Please refer to admission H&P for details, but in brief, 68 year old female with history of ongoing tobacco use, COPD not on oxygen at home, history of CVA and hypertension presented to [facility] long ED with progressive shortness of breath for past several days worsened one day prior to hospitalization. Patient also reports associated fevers or chills and productive cough with yellowish phlegm. In the ED patient was found to have WBC of 23,000 and a CT angiogram of chest done to rule out PE showed a right middle and lower lobe infiltrate suggestive of pneumonia.   Hospital Course:  Sepsis with acute hypoxic respiratory failure  Secondary to community-acquired pneumonia and COPD exacerbation. Placed On empiric IV Rocephin and azithromycin with good response. Cultures so far negative. Patient noted to have  significant progressive leukocytosis as well. She remains afebrile and wbc trending down.  -she will be discharged on oral levaquin to complete 7 day of antibiotics for pneumonia and tapering dose of prednisone.    Progressive leukocytosis  Possibly in the setting of underlying pneumonia and IV steroid use. Patient remains afebrile  and respiratory failure clinically improving. CT scan of the abdomen and pelvis given complaint of abdominal pain was negative  for intra-abdominal infection or abscess. CT scan of the chest done on admission showed pneumonia and was negative fro PE. Wbc slowly improving and needs follow up as outpt.   COPD exacerbation  Transitioned to oral prednisone and will discharge her on oral prednisone taper. Does not require home o2. Will prescribe albuterol nebs ( reports using it at home), dulera (  prescribed by PCP but not using  it regularly) and albuterol inhaler.  She was counseled strongly on smoking cessation. Prescribed  nicotine patch. Patient reports taking Chantix, thickened gums and patches in the past but was unable to quit smoking. i have told her to call 1-800-quit [place] for further assistance to quitting smoking.   Acute kidney injury  Present on admission and now resolved  with IV fluids. Possibly prerenal due to poor by mouth intake . Will resume lisinopril.  Anemia  Iron deficiency noted in labs. Normal Vitamin B12 level.EGD and colonoscopy done in 2013 showed gastritis and duodenitis. Also showed multiple colonic polyps. Patient at high risk for bleeding an developing gastric ulcer due to smoking. Counseled again on smoking cessation ad avoiding NSAIDs. Continue PPI.  Monitor as outpt   Hypothyroidism  Markedly suppressed TSH  Will reduce Synthroid dose to 100 ug daily .  Anxiety and depression  Continue with when necessary Xanax, Prozac and trazodone.   History of CVA  Continue with Plavix.   Generalized weakness  Physical therapy evaluation recommends HHPT  Diet: Low sodium  Code Status:  Full code   Family Communication: None at Bedside   Disposition Plan: Home with HHPT  Consultants:  None   Procedures:  CT angio chest CT abd and pelvis   Antibiotics:  IV Rocephin and azithromycin since 7/25     Discharge Exam: Filed Vitals:   03/01/14 0458  BP: 151/75   Pulse: 76  Temp: 97.9 F (36.6 C)  Resp: 20    General: Elderly female in no acute distress  HEENT: no Pallor, moist oral mucosa  Chest: clear bilaterally  CVS: Normal S1 and S2, no murmurs rub or gallop  Abdomen: Soft, nontender, nondistended, bowel sounds present  Extremities: Warm, no edema  CNS: Alert and oriented  Discharge Instructions You were cared for by a hospitalist during your hospital stay. If you have any questions about your discharge medications or the care you received while you were in the hospital after you are discharged, you can call the unit and asked to speak with the hospitalist on call if the hospitalist that took care of you is not available. Once you are discharged, your primary care physician will handle any further medical issues. Please note that NO REFILLS for any discharge medications will be authorized once you are discharged, as it is imperative that you return to your primary care physician (or establish a relationship with a primary care physician if you do not have one) for your aftercare needs so that they can reassess your need for medications and monitor your lab values.     Medication List         albuterol (2.5 MG/3ML) 0.083% nebulizer solution  Commonly known as:  PROVENTIL  Take 3 mLs (2.5 mg total) by nebulization every 6 (six) hours as needed for wheezing or shortness of breath.     albuterol 108 (90 BASE) MCG/ACT inhaler  Commonly known as:  PROVENTIL HFA;VENTOLIN HFA  Inhale 2 puffs into the lungs every 6 (six) hours as needed for wheezing or shortness of breath.     ALPRAZolam 0.25 MG tablet  Commonly known as:  XANAX  Take 0.25 mg by mouth 2 (two) times daily as needed for anxiety.     amLODipine 10 MG tablet  Commonly known as:  NORVASC  Take 10 mg by mouth at bedtime.     clopidogrel 75 MG tablet  Commonly known as:  PLAVIX  Take 1 tablet (75 mg total) by mouth daily.     FLUoxetine 40 MG capsule  Commonly known as:   PROZAC  Take 40 mg by mouth at bedtime.     HYDROcodone-acetaminophen 7.5-325 MG per tablet  Commonly known as:  NORCO  Take 1 tablet by mouth every 6 (six) hours as needed for moderate pain.     levofloxacin 750 MG tablet  Commonly known as:  LEVAQUIN  Take 1 tablet (750 mg total) by mouth daily. Until 03/04/2014     levothyroxine 100 MCG tablet  Commonly known as:  SYNTHROID, LEVOTHROID  Take 1 tablet (100 mcg total) by mouth daily before breakfast.     lisinopril 20 MG tablet  Commonly known as:  PRINIVIL,ZESTRIL  Take 20 mg by mouth at bedtime.     mometasone-formoterol 200-5 MCG/ACT Aero  Commonly known as:  DULERA  Inhale 2 puffs into the lungs 2 (two) times daily.     nicotine 21 mg/24hr patch  Commonly known as:  NICODERM CQ - dosed in mg/24 hours  Place 1 patch (21 mg total) onto the skin daily.  pantoprazole 40 MG tablet  Commonly known as:  PROTONIX  Take 40 mg by mouth daily.     pramipexole 0.25 MG tablet  Commonly known as:  MIRAPEX  Take 0.25 mg by mouth every evening.     predniSONE 20 MG tablet  Commonly known as:  DELTASONE  Take 1 tablet (20 mg total) by mouth daily with breakfast.     tiotropium 18 MCG inhalation capsule  Commonly known as:  SPIRIVA  Place 1 capsule (18 mcg total) into inhaler and inhale daily.     traZODone 50 MG tablet  Commonly known as:  DESYREL  Take 50 mg by mouth at bedtime.     triamcinolone cream 0.5 %  Commonly known as:  KENALOG  Apply topically 3 (three) times daily. Over right foot       No Known Allergies     Follow-up Information   Follow up with Cathlean Cower, MD. Schedule an appointment as soon as possible for a visit in 1 week.   Specialties:  Internal Medicine, Radiology   Contact information:   [address] [place] [address] [phone]        The results of significant diagnostics from this hospitalization (including imaging, microbiology, ancillary and laboratory) are listed  below for reference.    Significant Diagnostic Studies: Dg Chest 2 View  02/26/2014   CLINICAL DATA:  SHORTNESS OF BREATH  EXAM: CHEST - 2 VIEW  COMPARISON:  01/21/2014  FINDINGS: Mild hyperinflation with coarse patchy bibasilar interstitial markings. No confluent airspace disease or overt edema. Heart size upper limits normal. Mildly tortuous thoracic aorta. Regional bones unremarkable. Surgical clips in the upper abdomen. Orthopedic anchor in the right humeral head.  IMPRESSION: 1. Chronic interstitial changes without acute abnormality.   Electronically Signed   By: Arne Cleveland M.D.   On: 02/26/2014 13:30   Ct Head Wo Contrast  02/26/2014   CLINICAL DATA:  Severe headache  EXAM: CT HEAD WITHOUT CONTRAST  TECHNIQUE: Contiguous axial images were obtained from the base of the skull through the vertex without contrast.  COMPARISON:  08/15/2012  FINDINGS: Normal appearance of the intracranial structures. No evidence for acute hemorrhage, mass lesion, midline shift, hydrocephalus or large infarct. No acute bony abnormality. The visualized sinuses are clear.  IMPRESSION: No acute intracranial abnormality.   Electronically Signed   By: Daryll Brod M.D.   On: 02/26/2014 15:13   Ct Angio Chest Pe W/cm &/or Wo Cm  02/26/2014   CLINICAL DATA:  r/o pe  EXAM: CT ANGIOGRAPHY CHEST WITH CONTRAST  TECHNIQUE: Multidetector CT imaging of the chest was performed using the standard protocol during bolus administration of intravenous contrast. Multiplanar CT image reconstructions and MIPs were obtained to evaluate the vascular anatomy.  CONTRAST:  36mL OMNIPAQUE IOHEXOL 350 MG/ML SOLN  COMPARISON:  01/16/2014  FINDINGS: Satisfactory opacification of pulmonary arteries noted, and there is no evidence of pulmonary emboli. Adequate contrast opacification of the thoracic aorta with no evidence of dissection, aneurysm, or stenosis. There is classic 3 vessel brachiocephalic arch anatomy without proximal stenosis. Patchy  aortic and coronary plaque. No pleural or pericardial effusion. No hilar or mediastinal adenopathy. Subcentimeter prevascular and precarinal lymph nodes. Emphysematous changes throughout both lungs. Patchy airspace disease in the superior segment right lower lobe has developed. There are smaller focal areas of airspace disease in the inferior right middle lobe and medial right lower lobe. Minimal spondylitic changes in the mid thoracic spine. Visualized portions of upper  abdomen unremarkable.  Review of the MIP images confirms the above findings.  IMPRESSION: 1. Negative for acute PE or thoracic aortic dissection. 2. New patchy airspace disease in the right middle and lower lobes suggesting pneumonia. 3. Advanced COPD   Electronically Signed   By: Arne Cleveland M.D.   On: 02/26/2014 15:17   Ct Abdomen Pelvis W Contrast  02/28/2014   CLINICAL DATA:  Left lower quadrant pain with elevated white blood cell count; history of bladder malignancy and hysterectomy, cholecystectomy, and appendectomy.  EXAM: CT ABDOMEN AND PELVIS WITH CONTRAST  TECHNIQUE: Multidetector CT imaging of the abdomen and pelvis was performed using the standard protocol following bolus administration of intravenous contrast.  CONTRAST:  169mL OMNIPAQUE IOHEXOL 300 MG/ML SOLN intravenously. The patient also received oral contrast material  COMPARISON:  CT scan of the abdomen pelvis of January 21, 2014  FINDINGS: The gallbladder is surgically absent. The liver, pancreas, spleen, nondistended stomach, adrenal glands, and kidneys exhibit no acute abnormalities. There are stable cortically based hypodensities in both kidneys compatible with cysts. The largest lies on the left and is exophytic from the anterior aspect of the midpole. It measures 3.2 cm in greatest dimension. The urinary bladder is unremarkable. The uterus is surgically absent. There is a tiny amount of free pelvic fluid.  The oral contrast has traversed the normal appearing small bowel  and reached as far distally in the colon as the splenic flexure. A moderate stool burden is present within the ascending and transverse portions of the colon. The descending and rectosigmoid portions contain a normal stool and gas volume. There are scattered diverticula without evidence of diverticulitis. There is no intra-abdominal abscess nor lymphadenopathy. The psoas musculature is normal. There is no inguinal nor umbilical hernia.  The lung bases exhibit emphysematous changes and minimal scarring. The observed bony structures are unremarkable. There is mild annular disc bulging at L5-S1.  IMPRESSION: 1. No acute or chronic intra-abdominal or pelvic inflammatory process is demonstrated. There is no acute abnormality otherwise. There are stable hypodensities in both kidneys consistent with cysts. 2. There are emphysematous changes in both lungs and minimal scarring at the lung bases.   Electronically Signed   By: David  Martinique   On: 02/28/2014 14:52    Microbiology: Recent Results (from the past 240 hour(s))  CULTURE, BLOOD (ROUTINE X 2)     Status: None   Collection Time    02/26/14  3:56 PM      Result Value Ref Range Status   Specimen Description BLOOD LEFT ARM   Final   Special Requests BOTTLES DRAWN AEROBIC AND ANAEROBIC 3CC   Final   Culture  Setup Time     Final   Value: 02/26/2014 19:57     Performed at [facility]   Culture     Final   Value:        BLOOD CULTURE RECEIVED NO GROWTH TO DATE CULTURE WILL BE HELD FOR 5 DAYS BEFORE ISSUING A FINAL NEGATIVE REPORT     Performed at [facility]   Report Status PENDING   Incomplete  CULTURE, BLOOD (ROUTINE X 2)     Status: None   Collection Time    02/26/14  3:57 PM      Result Value Ref Range Status   Specimen Description BLOOD LEFT ANTECUBITAL   Final   Special Requests BOTTLES DRAWN AEROBIC AND ANAEROBIC 3CC   Final   Culture  Setup Time     Final  Value: 02/26/2014 19:57     Performed at [facility]    Culture     Final   Value:        BLOOD CULTURE RECEIVED NO GROWTH TO DATE CULTURE WILL BE HELD FOR 5 DAYS BEFORE ISSUING A FINAL NEGATIVE REPORT     Performed at [facility]   Report Status PENDING   Incomplete  CULTURE, EXPECTORATED SPUTUM-ASSESSMENT     Status: None   Collection Time    02/27/14  9:12 AM      Result Value Ref Range Status   Specimen Description SPUTUM   Final   Special Requests NONE   Final   Sputum evaluation     Final   Value: MICROSCOPIC FINDINGS SUGGEST THAT THIS SPECIMEN IS NOT REPRESENTATIVE OF LOWER RESPIRATORY SECRETIONS. PLEASE RECOLLECT.     SPOKE WITH STEWART,S RN @ [phone] BY HOOKER,B   Report Status 02/27/2014 FINAL   Final     Labs: Basic Metabolic Panel:  Recent Labs Lab 02/26/14 1243 02/26/14 1815 02/27/14 0500 02/28/14 0038 03/01/14 0507  NA 139  --  137 138 140  K 4.2  --  4.1 5.0 3.7  CL 103  --  101 103 100  CO2 22  --  21 23 28   GLUCOSE 116*  --  152* 138* 100*  BUN 15  --  20 18 15   CREATININE 1.32* 1.36* 1.31* 1.21* 1.09  CALCIUM 9.3  --  8.9 8.9 9.1   Liver Function Tests:  Recent Labs Lab 02/27/14 0500  AST 8  ALT 6  ALKPHOS 91  BILITOT <0.2*  PROT 6.0  ALBUMIN 2.9*   No results found for this basename: LIPASE, AMYLASE,  in the last 168 hours No results found for this basename: AMMONIA,  in the last 168 hours CBC:  Recent Labs Lab 02/26/14 1243 02/26/14 1815 02/27/14 0500 02/28/14 0038 03/01/14 0507  WBC 23.2* 20.2* 22.9* 30.3* 21.5*  NEUTROABS  --   --   --   --  18.4*  HGB 12.0 10.8* 10.6* 10.1* 10.7*  HCT 36.5 32.5* 32.3* 31.1* 33.1*  MCV 87.3 87.1 86.6 87.4 88.3  PLT 300 296 324 304 298   Cardiac Enzymes:  Recent Labs Lab 02/26/14 1243  TROPONINI <0.30   BNP: BNP (last 3 results)  Recent Labs  02/26/14 1243  PROBNP 254.9*   CBG: No results found for this basename: GLUCAP,  in the last 168 hours     Signed:  Haleema Vanderheyden  [facility] 03/01/2014, 10:23  AM

## 2014-03-01 NOTE — Progress Notes (Addendum)
Physical Therapy Treatment Patient Details Name: Cheryl Mcguire MRN: [id number] DOB: 1946/06/15 Today's Date: 03/01/2014    History of Present Illness 68 yo female admitted with sepsis, weakness, Pna, COPD exac. Hx of COPD, HTN, CVA, emphysema, R rotator cuff repair 2013    PT Comments    Pt tolerated activity/distance well. No LOB during session. Encouraged pt to continue to use cane for stability. Discussed d/c plan-pt politely declines HHPT-reports she will have assist as needed at home. Pt plans to d/c home on today.   Follow Up Recommendations  Supervision - Intermittent (pt politely declines HHPT )     Equipment Recommendations  None recommended by PT    Recommendations for Other Services       Precautions / Restrictions Precautions Precautions: Fall Restrictions Weight Bearing Restrictions: No    Mobility  Bed Mobility Overal bed mobility: Modified Independent                Transfers Overall transfer level: Modified independent                  Ambulation/Gait Ambulation/Gait assistance: Min guard Ambulation Distance (Feet): 200 Feet Assistive device: Straight cane Gait Pattern/deviations: Step-through pattern;Decreased stride length     General Gait Details: close guard for safety. No LOB this session. Pt tolerated distance well.    Stairs            Wheelchair Mobility    Modified Rankin (Stroke Patients Only)       Balance                                    Cognition Arousal/Alertness: Awake/alert Behavior During Therapy: WFL for tasks assessed/performed Overall Cognitive Status: Within Functional Limits for tasks assessed                      Exercises      General Comments        Pertinent Vitals/Pain Pt declines pain    Home Living                      Prior Function            PT Goals (current goals can now be found in the care plan section) Progress towards PT goals:  Progressing toward goals    Frequency  Min 3X/week    PT Plan Current plan remains appropriate    Co-evaluation             End of Session Equipment Utilized During Treatment: Gait belt Activity Tolerance: Patient tolerated treatment well Patient left: in bed;with call bell/phone within reach (sitting at edge of bed)     Time: 0953-1001 PT Time Calculation (min): 8 min  Charges:  $Gait Training: 8-22 mins                    G Codes:      Weston Anna, MPT Pager: [phone]

## 2014-03-01 NOTE — Discharge Instructions (Addendum)
You Can Quit Smoking If you are ready to quit smoking or are thinking about it, congratulations! You have chosen to help yourself be healthier and live longer! There are lots of different ways to quit smoking. Nicotine gum, nicotine patches, a nicotine inhaler, or nicotine nasal spray can help with physical craving. Hypnosis, support groups, and medicines help break the habit of smoking. TIPS TO GET OFF AND STAY OFF CIGARETTES  Learn to predict your moods. Do not let a bad situation be your excuse to have a cigarette. Some situations in your life might tempt you to have a cigarette.  Ask friends and co-workers not to smoke around you.  Make your home smoke-free.  Never have "just one" cigarette. It leads to wanting another and another. Remind yourself of your decision to quit.  On a card, make a list of your reasons for not smoking. Read it at least the same number of times a day as you have a cigarette. Tell yourself everyday, "I do not want to smoke. I choose not to smoke."  Ask someone at home or work to help you with your plan to quit smoking.  Have something planned after you eat or have a cup of coffee. Take a walk or get other exercise to perk you up. This will help to keep you from overeating.  Try a relaxation exercise to calm you down and decrease your stress. Remember, you may be tense and nervous the first two weeks after you quit. This will pass.  Find new activities to keep your hands busy. Play with a pen, coin, or rubber band. Doodle or draw things on paper.  Brush your teeth right after eating. This will help cut down the craving for the taste of tobacco after meals. You can try mouthwash too.  Try gum, breath mints, or diet candy to keep something in your mouth. IF YOU SMOKE AND WANT TO QUIT:  Do not stock up on cigarettes. Never buy a carton. Wait until one pack is finished before you buy another.  Never carry cigarettes with you at work or at home.  Keep cigarettes  as far away from you as possible. Leave them with someone else.  Never carry matches or a lighter with you.  Ask yourself, "Do I need this cigarette or is this just a reflex?"  Bet with someone that you can quit. Put cigarette money in a piggy bank every morning. If you smoke, you give up the money. If you do not smoke, by the end of the week, you keep the money.  Keep trying. It takes 21 days to change a habit!  Talk to your doctor about using medicines to help you quit. These include nicotine replacement gum, lozenges, or skin patches. Document Released: 05/18/2009 Document Revised: 10/14/2011 Document Reviewed: 05/18/2009 ExitCare Patient Information 2015 [employer]. This information is not intended to replace advice given to you by your health care provider. Make sure you discuss any questions you have with your health care provider.  

## 2014-03-01 NOTE — Progress Notes (Signed)
Spoke with patient about setting up home health services prior to discharge but she is refusing home health needs. She lives with her ex-husband and doesn't feel the needs.  Case Manager Rubin Payor is aware of this.

## 2014-03-01 NOTE — Progress Notes (Signed)
CARE MANAGEMENT NOTE 03/01/2014  Patient:  Cheryl Mcguire, Cheryl Mcguire   Account Number:  [id number]  Date Initiated:  03/01/2014  Documentation initiated by:  Gabriel Earing  Subjective/Objective Assessment:   PT ADMITTED WITH SEPSIS     Action/Plan:   FROM HOME   Anticipated DC Date:  03/01/2014   Anticipated DC Plan:  [place]  CM consult      Choice offered to / List presented to:             Status of service:  In process, will continue to follow Medicare Important Message given?  NA - LOS <3 / Initial given by admissions (If response is "NO", the following Medicare IM given date fields will be blank) Date Medicare IM given:   Medicare IM given by:   Date Additional Medicare IM given:   Additional Medicare IM given by:    Discharge Disposition:  HOME/SELF CARE  Per UR Regulation:  Reviewed for med. necessity/level of care/duration of stay  If discussed at [place] of Stay Meetings, dates discussed:    Comments:  03/01/14 MMCGIBBONEY, RN, BSN RN REPORTS PT DECLINED HHPT.

## 2014-03-02 NOTE — ED Provider Notes (Signed)
Medical screening examination/treatment/procedure(s) were conducted as a shared visit with non-physician practitioner(s) and myself.  I personally evaluated the patient during the encounter.   EKG Interpretation   Date/Time:  Saturday February 26 2014 12:31:51 EDT Ventricular Rate:  89 PR Interval:  116 QRS Duration: 77 QT Interval:  365 QTC Calculation: 444 R Axis:   28 Text Interpretation:  Sinus rhythm Borderline short PR interval Probable  left atrial enlargement Abnormal R-wave progression, early transition ED  PHYSICIAN INTERPRETATION AVAILABLE IN [facility] Confirmed by TEST,  Record ([id number]) on 02/28/2014 7:49:30 AM     68 year old female with dyspnea. Workup consistent with pneumonia. Given her age, history of underlying COPD and some increased work of breathing I feel that admission is indicated.  Virgel Manifold, MD 03/02/14 1345

## 2014-03-04 LAB — CULTURE, BLOOD (ROUTINE X 2)
CULTURE: NO GROWTH
Culture: NO GROWTH

## 2014-05-04 ENCOUNTER — Encounter (HOSPITAL_COMMUNITY): Payer: Self-pay | Admitting: Emergency Medicine

## 2014-05-04 ENCOUNTER — Emergency Department (HOSPITAL_COMMUNITY)
Admission: EM | Admit: 2014-05-04 | Discharge: 2014-05-04 | Discharge status: Against Medical Advice | Payer: PRIVATE HEALTH INSURANCE | Attending: Emergency Medicine | Admitting: Emergency Medicine

## 2014-05-04 DIAGNOSIS — K5900 Constipation, unspecified: Secondary | ICD-10-CM | POA: Insufficient documentation

## 2014-05-04 DIAGNOSIS — I10 Essential (primary) hypertension: Secondary | ICD-10-CM | POA: Diagnosis not present

## 2014-05-04 DIAGNOSIS — F17200 Nicotine dependence, unspecified, uncomplicated: Secondary | ICD-10-CM | POA: Diagnosis not present

## 2014-05-04 DIAGNOSIS — J449 Chronic obstructive pulmonary disease, unspecified: Secondary | ICD-10-CM | POA: Insufficient documentation

## 2014-05-04 DIAGNOSIS — R5381 Other malaise: Secondary | ICD-10-CM | POA: Insufficient documentation

## 2014-05-04 DIAGNOSIS — R5383 Other fatigue: Secondary | ICD-10-CM

## 2014-05-04 DIAGNOSIS — J4489 Other specified chronic obstructive pulmonary disease: Secondary | ICD-10-CM | POA: Insufficient documentation

## 2014-05-04 LAB — I-STAT CHEM 8, ED
BUN: 9 mg/dL (ref 6–23)
CHLORIDE: 97 meq/L (ref 96–112)
Calcium, Ion: 1.09 mmol/L — ABNORMAL LOW (ref 1.13–1.30)
Creatinine, Ser: 1.3 mg/dL — ABNORMAL HIGH (ref 0.50–1.10)
GLUCOSE: 89 mg/dL (ref 70–99)
HEMATOCRIT: 36 % (ref 36.0–46.0)
Hemoglobin: 12.2 g/dL (ref 12.0–15.0)
Potassium: 4.7 mEq/L (ref 3.7–5.3)
Sodium: 132 mEq/L — ABNORMAL LOW (ref 137–147)
TCO2: 28 mmol/L (ref 0–100)

## 2014-05-04 NOTE — ED Notes (Signed)
Called for pt. Bun no response

## 2014-05-04 NOTE — ED Notes (Signed)
Per pt sts constipation x 12 days and small amount of liquid. sts she is weak and had elevated blood count a few days ago at [facility].

## 2014-05-04 NOTE — ED Notes (Signed)
Called for pt. No repsonse

## 2014-05-11 IMAGING — CR DG ABDOMEN ACUTE W/ 1V CHEST
3 series · 3 of 3 positions shown · non-contrast
Comparison: 10/09/2011

CLINICAL DATA: Pain

ACUTE ABDOMEN SERIES (ABDOMEN 2 VIEW & CHEST 1 VIEW)

[view not recorded (1 of 3)]
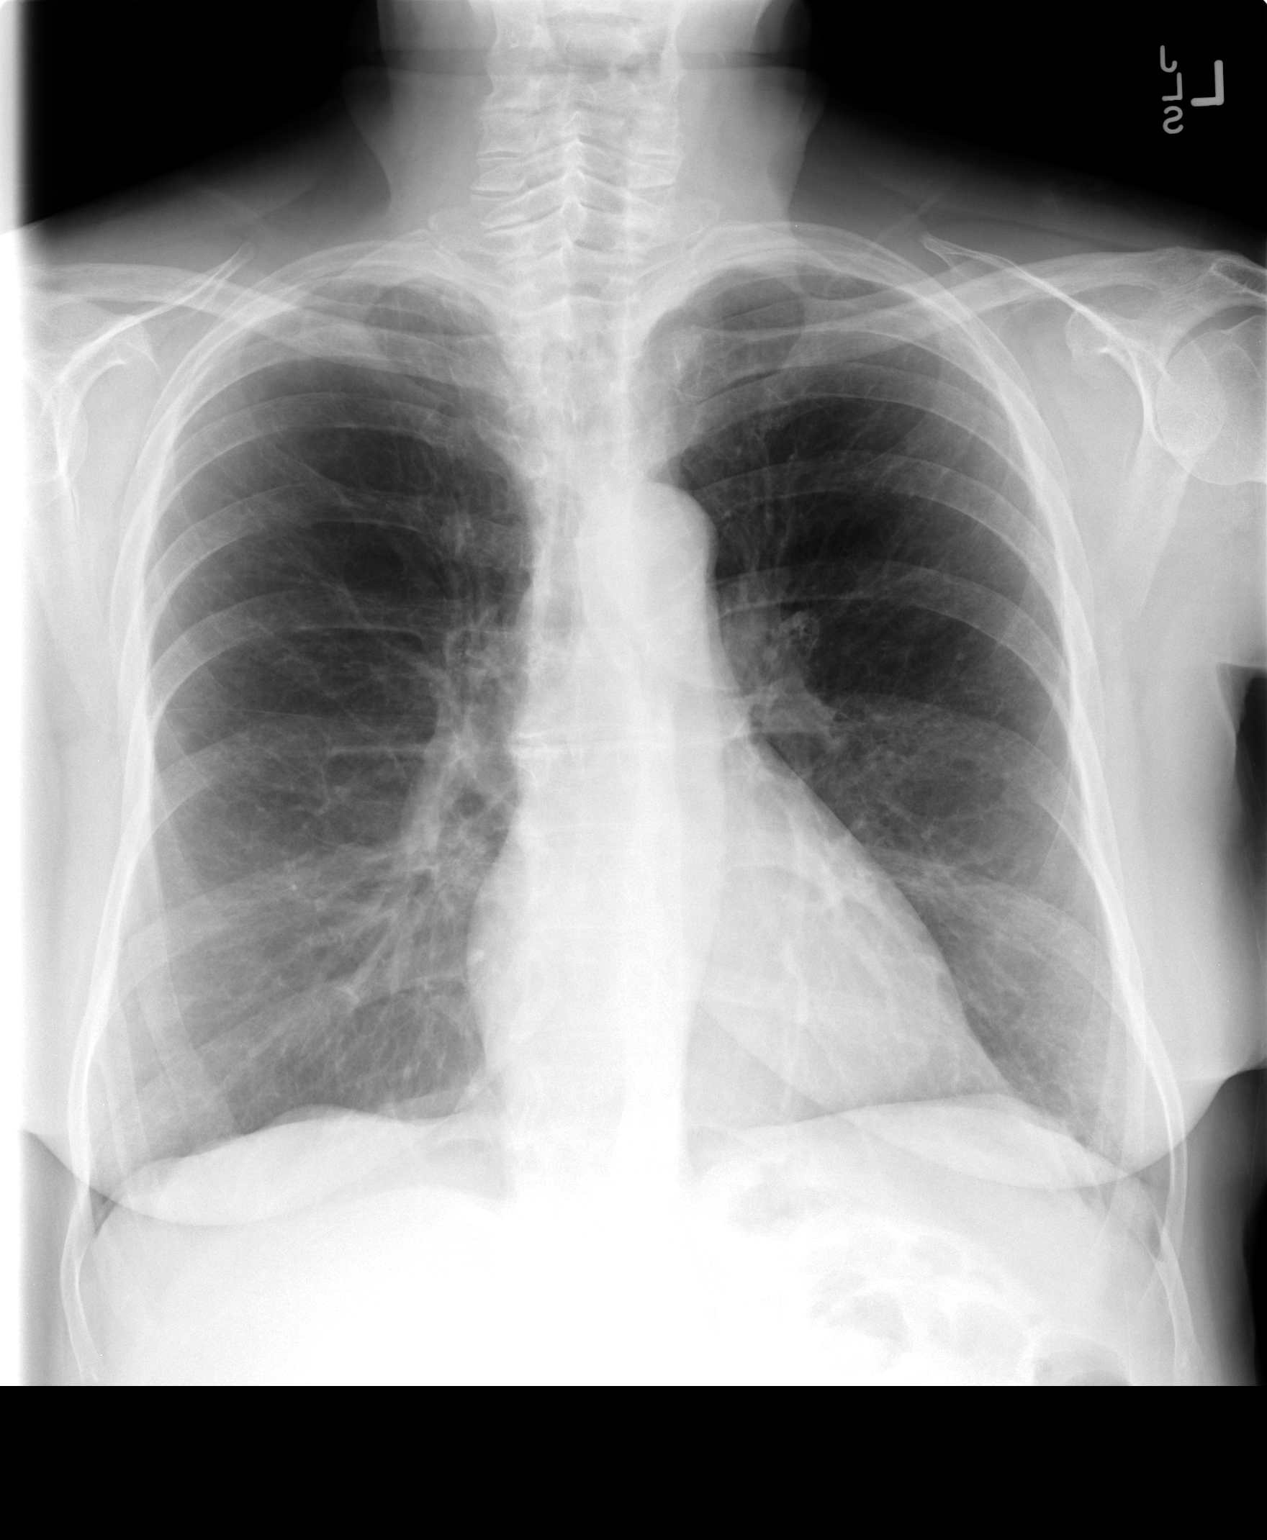

[view not recorded (2 of 3)]
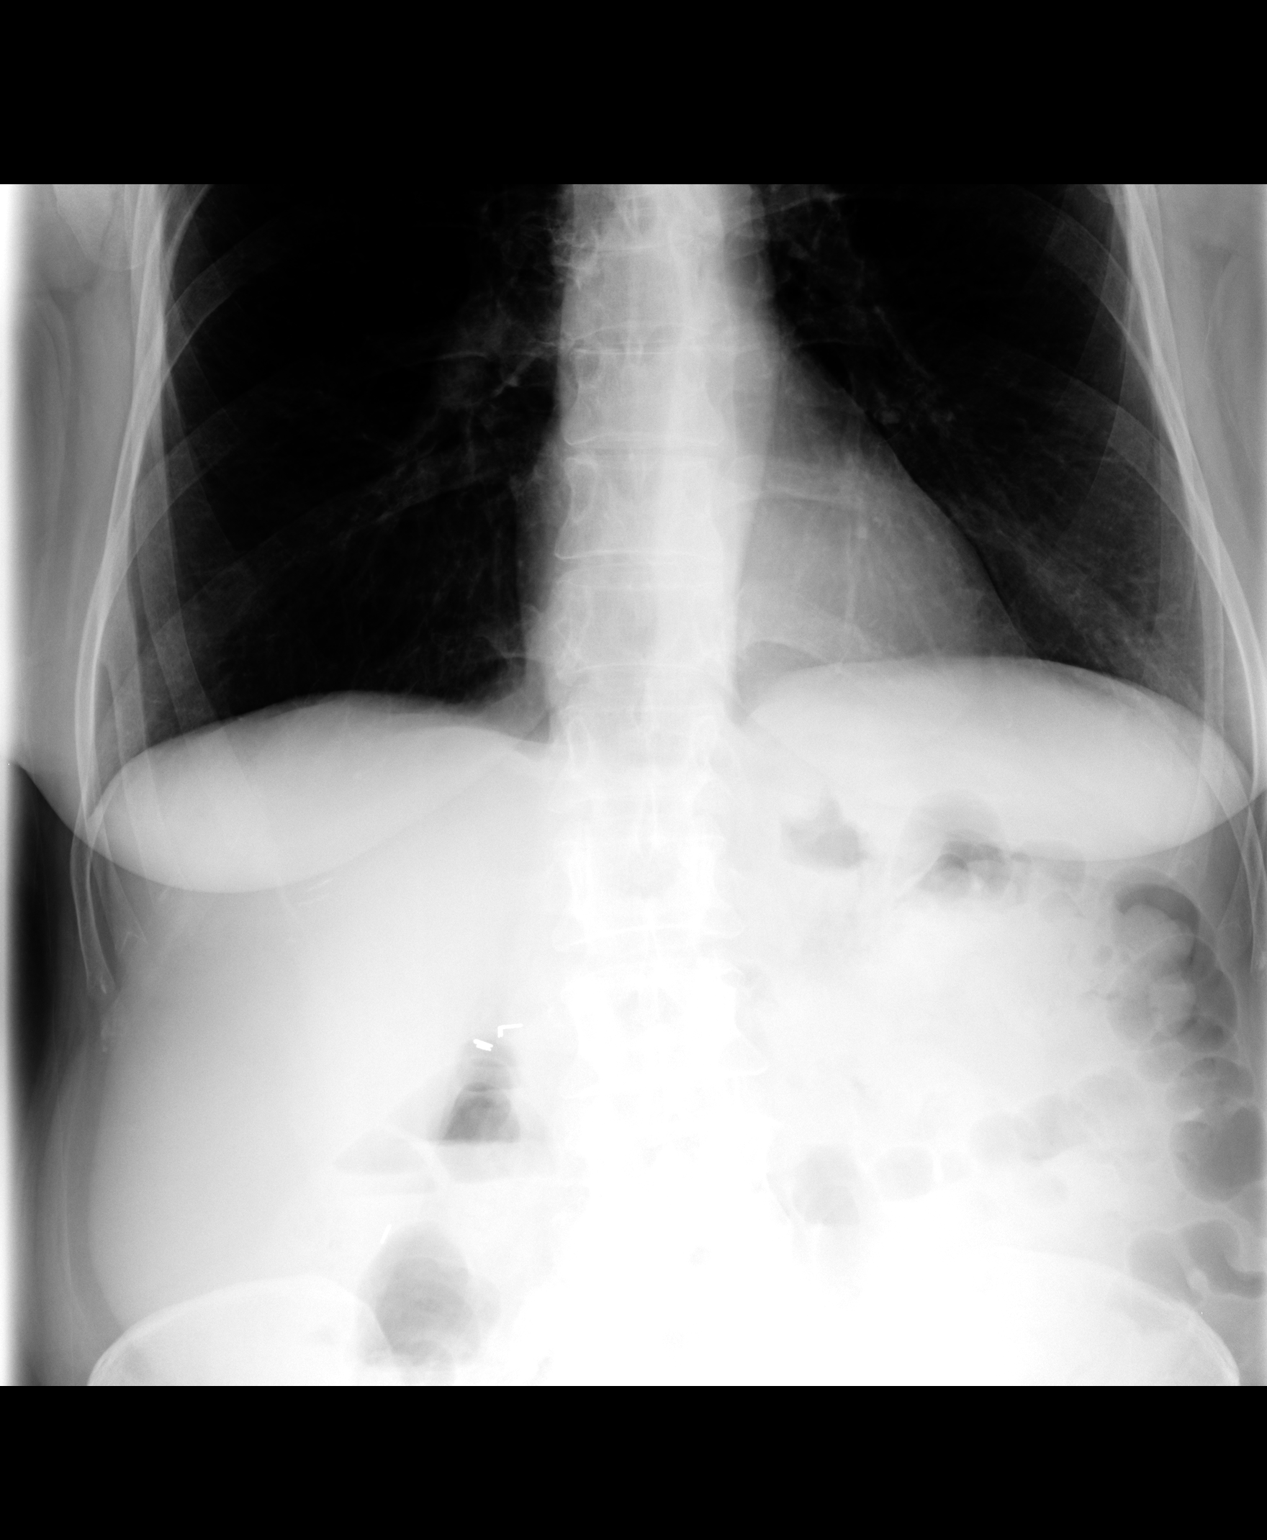

[view not recorded (3 of 3)]
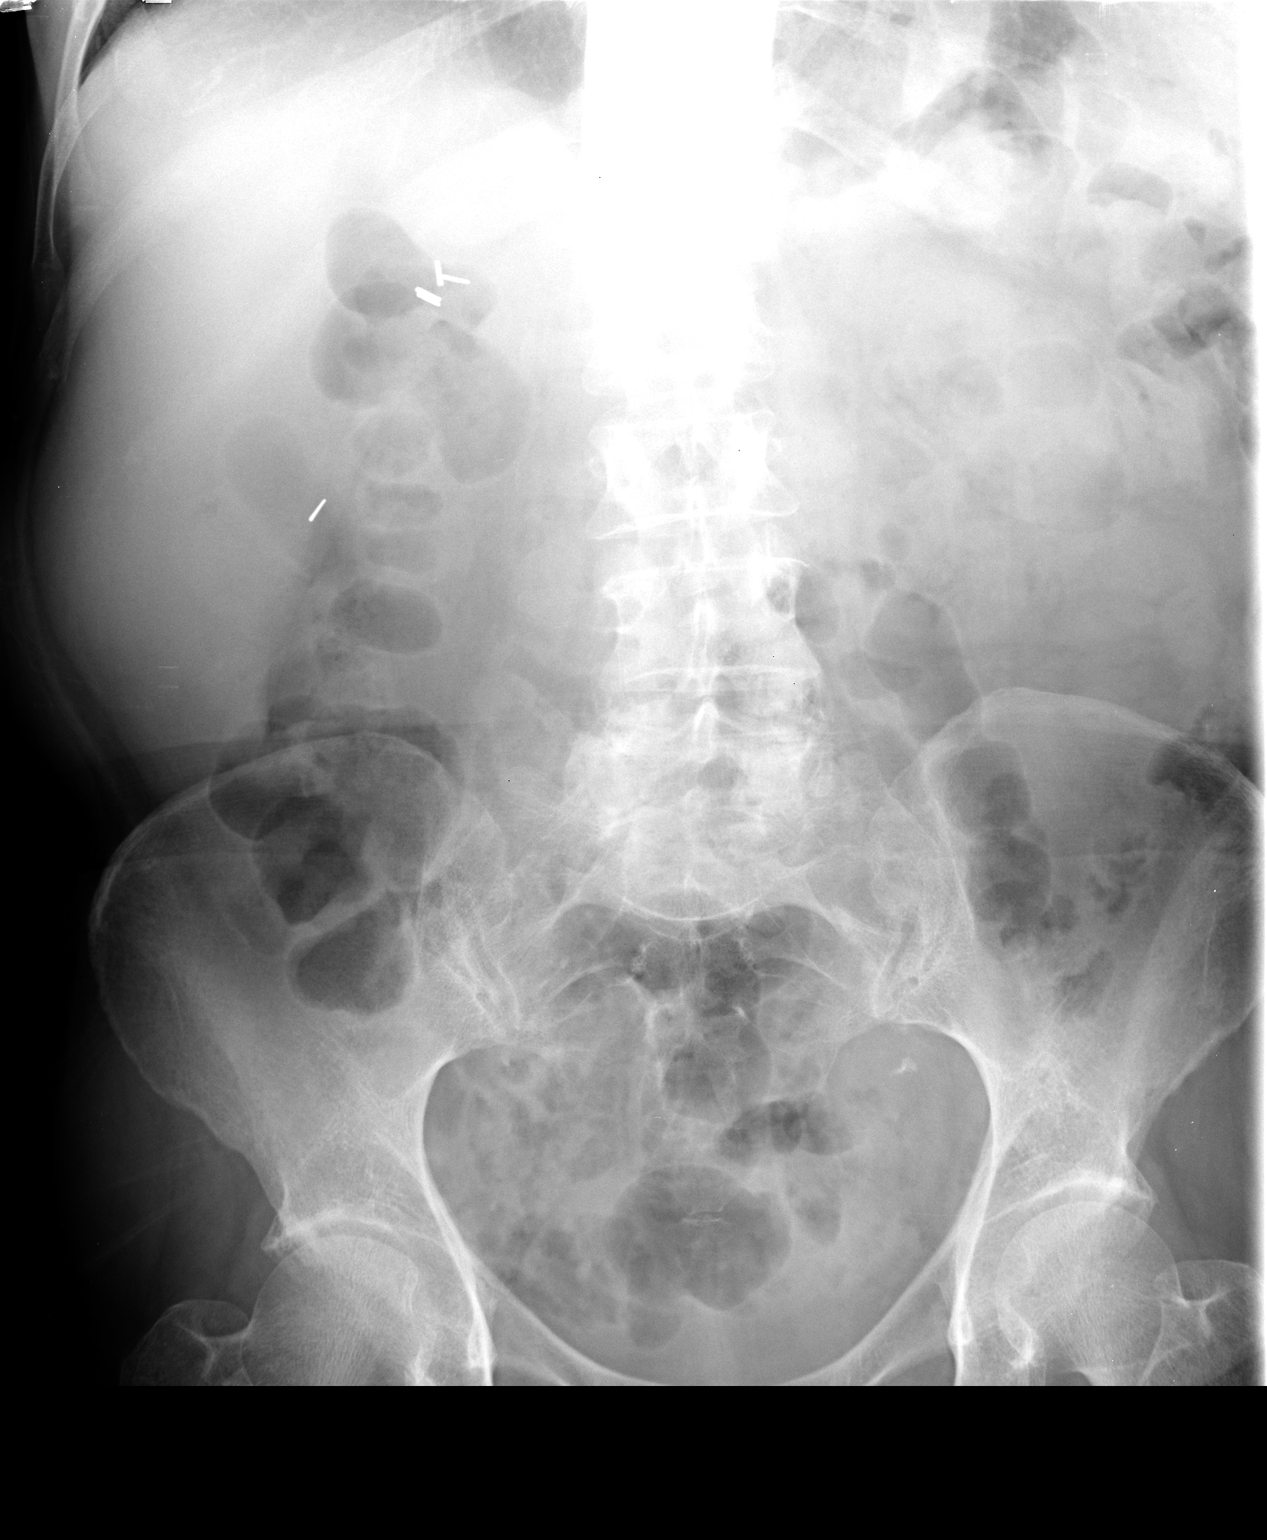

[3 of 3 positions shown; findings below may reference images not displayed]

FINDINGS: Normal heart size.  Lungs are hyperaerated and clear.

No free intraperitoneal gas in the abdomen.  No disproportionate
dilatation of bowel.  Nonspecific air fluid levels.
IMPRESSION: No active cardiopulmonary disease.  Nonobstructive bowel gas
pattern.

## 2014-08-06 DIAGNOSIS — R072 Precordial pain: Secondary | ICD-10-CM | POA: Diagnosis not present

## 2014-08-06 DIAGNOSIS — I2510 Atherosclerotic heart disease of native coronary artery without angina pectoris: Secondary | ICD-10-CM | POA: Diagnosis not present

## 2014-08-06 DIAGNOSIS — R079 Chest pain, unspecified: Secondary | ICD-10-CM | POA: Diagnosis not present

## 2014-08-06 DIAGNOSIS — R1010 Upper abdominal pain, unspecified: Secondary | ICD-10-CM | POA: Diagnosis not present

## 2014-08-06 DIAGNOSIS — E039 Hypothyroidism, unspecified: Secondary | ICD-10-CM | POA: Diagnosis not present

## 2014-08-06 DIAGNOSIS — R110 Nausea: Secondary | ICD-10-CM | POA: Diagnosis not present

## 2014-08-06 DIAGNOSIS — Z8673 Personal history of transient ischemic attack (TIA), and cerebral infarction without residual deficits: Secondary | ICD-10-CM | POA: Diagnosis not present

## 2014-08-06 DIAGNOSIS — E780 Pure hypercholesterolemia: Secondary | ICD-10-CM | POA: Diagnosis not present

## 2014-08-06 DIAGNOSIS — E0590 Thyrotoxicosis, unspecified without thyrotoxic crisis or storm: Secondary | ICD-10-CM | POA: Diagnosis not present

## 2014-08-06 DIAGNOSIS — F17210 Nicotine dependence, cigarettes, uncomplicated: Secondary | ICD-10-CM | POA: Diagnosis not present

## 2014-08-06 DIAGNOSIS — I10 Essential (primary) hypertension: Secondary | ICD-10-CM | POA: Diagnosis not present

## 2014-08-30 DIAGNOSIS — N189 Chronic kidney disease, unspecified: Secondary | ICD-10-CM | POA: Diagnosis not present

## 2014-08-30 DIAGNOSIS — W19XXXA Unspecified fall, initial encounter: Secondary | ICD-10-CM | POA: Diagnosis not present

## 2014-08-30 DIAGNOSIS — Z8673 Personal history of transient ischemic attack (TIA), and cerebral infarction without residual deficits: Secondary | ICD-10-CM | POA: Diagnosis not present

## 2014-08-30 DIAGNOSIS — M25552 Pain in left hip: Secondary | ICD-10-CM | POA: Diagnosis not present

## 2014-08-30 DIAGNOSIS — F17210 Nicotine dependence, cigarettes, uncomplicated: Secondary | ICD-10-CM | POA: Diagnosis not present

## 2014-08-30 DIAGNOSIS — S3992XA Unspecified injury of lower back, initial encounter: Secondary | ICD-10-CM | POA: Diagnosis not present

## 2014-08-30 DIAGNOSIS — Z7901 Long term (current) use of anticoagulants: Secondary | ICD-10-CM | POA: Diagnosis not present

## 2014-08-30 DIAGNOSIS — I129 Hypertensive chronic kidney disease with stage 1 through stage 4 chronic kidney disease, or unspecified chronic kidney disease: Secondary | ICD-10-CM | POA: Diagnosis not present

## 2014-08-30 DIAGNOSIS — E780 Pure hypercholesterolemia: Secondary | ICD-10-CM | POA: Diagnosis not present

## 2014-08-30 DIAGNOSIS — M79604 Pain in right leg: Secondary | ICD-10-CM | POA: Diagnosis not present

## 2014-09-13 ENCOUNTER — Institutional Professional Consult (permissible substitution): Payer: Medicaid Other | Admitting: Critical Care Medicine

## 2014-09-13 DIAGNOSIS — J441 Chronic obstructive pulmonary disease with (acute) exacerbation: Secondary | ICD-10-CM | POA: Diagnosis not present

## 2014-09-13 DIAGNOSIS — N3091 Cystitis, unspecified with hematuria: Secondary | ICD-10-CM | POA: Diagnosis not present

## 2014-09-13 DIAGNOSIS — I129 Hypertensive chronic kidney disease with stage 1 through stage 4 chronic kidney disease, or unspecified chronic kidney disease: Secondary | ICD-10-CM | POA: Diagnosis not present

## 2014-09-13 DIAGNOSIS — E039 Hypothyroidism, unspecified: Secondary | ICD-10-CM | POA: Diagnosis not present

## 2014-09-13 DIAGNOSIS — K529 Noninfective gastroenteritis and colitis, unspecified: Secondary | ICD-10-CM | POA: Diagnosis not present

## 2014-09-13 DIAGNOSIS — F329 Major depressive disorder, single episode, unspecified: Secondary | ICD-10-CM | POA: Diagnosis not present

## 2014-09-13 DIAGNOSIS — C678 Malignant neoplasm of overlapping sites of bladder: Secondary | ICD-10-CM | POA: Diagnosis not present

## 2014-09-13 DIAGNOSIS — Z720 Tobacco use: Secondary | ICD-10-CM | POA: Diagnosis not present

## 2014-09-13 DIAGNOSIS — E872 Acidosis: Secondary | ICD-10-CM | POA: Diagnosis not present

## 2014-09-13 DIAGNOSIS — R269 Unspecified abnormalities of gait and mobility: Secondary | ICD-10-CM | POA: Diagnosis not present

## 2014-09-13 DIAGNOSIS — F419 Anxiety disorder, unspecified: Secondary | ICD-10-CM | POA: Diagnosis not present

## 2014-09-13 DIAGNOSIS — F17210 Nicotine dependence, cigarettes, uncomplicated: Secondary | ICD-10-CM | POA: Diagnosis not present

## 2014-09-13 DIAGNOSIS — R312 Other microscopic hematuria: Secondary | ICD-10-CM | POA: Diagnosis not present

## 2014-09-13 DIAGNOSIS — N189 Chronic kidney disease, unspecified: Secondary | ICD-10-CM | POA: Diagnosis not present

## 2014-09-13 DIAGNOSIS — N289 Disorder of kidney and ureter, unspecified: Secondary | ICD-10-CM | POA: Diagnosis not present

## 2014-09-13 DIAGNOSIS — R933 Abnormal findings on diagnostic imaging of other parts of digestive tract: Secondary | ICD-10-CM | POA: Diagnosis not present

## 2014-09-13 DIAGNOSIS — Z9049 Acquired absence of other specified parts of digestive tract: Secondary | ICD-10-CM | POA: Diagnosis not present

## 2014-09-13 DIAGNOSIS — Z9889 Other specified postprocedural states: Secondary | ICD-10-CM | POA: Diagnosis not present

## 2014-09-13 DIAGNOSIS — R531 Weakness: Secondary | ICD-10-CM | POA: Diagnosis not present

## 2014-09-13 DIAGNOSIS — R10814 Left lower quadrant abdominal tenderness: Secondary | ICD-10-CM | POA: Diagnosis not present

## 2014-09-13 DIAGNOSIS — K219 Gastro-esophageal reflux disease without esophagitis: Secondary | ICD-10-CM | POA: Diagnosis not present

## 2014-09-13 DIAGNOSIS — R351 Nocturia: Secondary | ICD-10-CM | POA: Diagnosis not present

## 2014-09-13 DIAGNOSIS — E780 Pure hypercholesterolemia: Secondary | ICD-10-CM | POA: Diagnosis not present

## 2014-09-13 DIAGNOSIS — Z90710 Acquired absence of both cervix and uterus: Secondary | ICD-10-CM | POA: Diagnosis not present

## 2014-09-13 DIAGNOSIS — E785 Hyperlipidemia, unspecified: Secondary | ICD-10-CM | POA: Diagnosis not present

## 2014-09-13 DIAGNOSIS — Z23 Encounter for immunization: Secondary | ICD-10-CM | POA: Diagnosis not present

## 2014-09-13 DIAGNOSIS — I2510 Atherosclerotic heart disease of native coronary artery without angina pectoris: Secondary | ICD-10-CM | POA: Diagnosis not present

## 2014-09-13 DIAGNOSIS — Z9089 Acquired absence of other organs: Secondary | ICD-10-CM | POA: Diagnosis not present

## 2014-09-13 DIAGNOSIS — Z716 Tobacco abuse counseling: Secondary | ICD-10-CM | POA: Diagnosis not present

## 2014-09-13 DIAGNOSIS — J449 Chronic obstructive pulmonary disease, unspecified: Secondary | ICD-10-CM | POA: Diagnosis not present

## 2014-09-13 DIAGNOSIS — R740 Nonspecific elevation of levels of transaminase and lactic acid dehydrogenase [LDH]: Secondary | ICD-10-CM | POA: Diagnosis not present

## 2014-09-19 DIAGNOSIS — F329 Major depressive disorder, single episode, unspecified: Secondary | ICD-10-CM | POA: Diagnosis not present

## 2014-09-19 DIAGNOSIS — J441 Chronic obstructive pulmonary disease with (acute) exacerbation: Secondary | ICD-10-CM | POA: Diagnosis not present

## 2014-09-20 DIAGNOSIS — J441 Chronic obstructive pulmonary disease with (acute) exacerbation: Secondary | ICD-10-CM | POA: Diagnosis not present

## 2014-09-20 DIAGNOSIS — F329 Major depressive disorder, single episode, unspecified: Secondary | ICD-10-CM | POA: Diagnosis not present

## 2014-09-21 DIAGNOSIS — J441 Chronic obstructive pulmonary disease with (acute) exacerbation: Secondary | ICD-10-CM | POA: Diagnosis not present

## 2014-09-21 DIAGNOSIS — F329 Major depressive disorder, single episode, unspecified: Secondary | ICD-10-CM | POA: Diagnosis not present

## 2014-09-22 DIAGNOSIS — L600 Ingrowing nail: Secondary | ICD-10-CM | POA: Diagnosis not present

## 2014-09-22 DIAGNOSIS — J449 Chronic obstructive pulmonary disease, unspecified: Secondary | ICD-10-CM | POA: Diagnosis not present

## 2014-09-22 DIAGNOSIS — K5732 Diverticulitis of large intestine without perforation or abscess without bleeding: Secondary | ICD-10-CM | POA: Diagnosis not present

## 2014-09-22 DIAGNOSIS — Z7902 Long term (current) use of antithrombotics/antiplatelets: Secondary | ICD-10-CM | POA: Diagnosis not present

## 2014-09-22 DIAGNOSIS — I10 Essential (primary) hypertension: Secondary | ICD-10-CM | POA: Diagnosis not present

## 2014-09-22 DIAGNOSIS — F329 Major depressive disorder, single episode, unspecified: Secondary | ICD-10-CM | POA: Diagnosis not present

## 2014-09-22 DIAGNOSIS — R1084 Generalized abdominal pain: Secondary | ICD-10-CM | POA: Diagnosis not present

## 2014-09-22 DIAGNOSIS — J441 Chronic obstructive pulmonary disease with (acute) exacerbation: Secondary | ICD-10-CM | POA: Diagnosis not present

## 2014-09-22 DIAGNOSIS — Z792 Long term (current) use of antibiotics: Secondary | ICD-10-CM | POA: Diagnosis not present

## 2014-09-22 DIAGNOSIS — R109 Unspecified abdominal pain: Secondary | ICD-10-CM | POA: Diagnosis not present

## 2014-09-22 DIAGNOSIS — K529 Noninfective gastroenteritis and colitis, unspecified: Secondary | ICD-10-CM | POA: Diagnosis not present

## 2014-09-23 DIAGNOSIS — F329 Major depressive disorder, single episode, unspecified: Secondary | ICD-10-CM | POA: Diagnosis not present

## 2014-09-23 DIAGNOSIS — K529 Noninfective gastroenteritis and colitis, unspecified: Secondary | ICD-10-CM | POA: Diagnosis not present

## 2014-09-23 DIAGNOSIS — J441 Chronic obstructive pulmonary disease with (acute) exacerbation: Secondary | ICD-10-CM | POA: Diagnosis not present

## 2014-09-26 DIAGNOSIS — F329 Major depressive disorder, single episode, unspecified: Secondary | ICD-10-CM | POA: Diagnosis not present

## 2014-09-26 DIAGNOSIS — J441 Chronic obstructive pulmonary disease with (acute) exacerbation: Secondary | ICD-10-CM | POA: Diagnosis not present

## 2014-09-28 DIAGNOSIS — F329 Major depressive disorder, single episode, unspecified: Secondary | ICD-10-CM | POA: Diagnosis not present

## 2014-09-28 DIAGNOSIS — J441 Chronic obstructive pulmonary disease with (acute) exacerbation: Secondary | ICD-10-CM | POA: Diagnosis not present

## 2014-09-30 DIAGNOSIS — F329 Major depressive disorder, single episode, unspecified: Secondary | ICD-10-CM | POA: Diagnosis not present

## 2014-09-30 DIAGNOSIS — J441 Chronic obstructive pulmonary disease with (acute) exacerbation: Secondary | ICD-10-CM | POA: Diagnosis not present

## 2014-10-04 DIAGNOSIS — F329 Major depressive disorder, single episode, unspecified: Secondary | ICD-10-CM | POA: Diagnosis not present

## 2014-10-04 DIAGNOSIS — J441 Chronic obstructive pulmonary disease with (acute) exacerbation: Secondary | ICD-10-CM | POA: Diagnosis not present

## 2014-10-05 DIAGNOSIS — J441 Chronic obstructive pulmonary disease with (acute) exacerbation: Secondary | ICD-10-CM | POA: Diagnosis not present

## 2014-10-05 DIAGNOSIS — F329 Major depressive disorder, single episode, unspecified: Secondary | ICD-10-CM | POA: Diagnosis not present

## 2014-10-07 DIAGNOSIS — F329 Major depressive disorder, single episode, unspecified: Secondary | ICD-10-CM | POA: Diagnosis not present

## 2014-10-07 DIAGNOSIS — J441 Chronic obstructive pulmonary disease with (acute) exacerbation: Secondary | ICD-10-CM | POA: Diagnosis not present

## 2014-10-10 DIAGNOSIS — I2510 Atherosclerotic heart disease of native coronary artery without angina pectoris: Secondary | ICD-10-CM | POA: Diagnosis not present

## 2014-10-10 DIAGNOSIS — R079 Chest pain, unspecified: Secondary | ICD-10-CM | POA: Diagnosis not present

## 2014-10-10 DIAGNOSIS — Z79899 Other long term (current) drug therapy: Secondary | ICD-10-CM | POA: Diagnosis not present

## 2014-10-10 DIAGNOSIS — Z716 Tobacco abuse counseling: Secondary | ICD-10-CM | POA: Diagnosis not present

## 2014-10-10 DIAGNOSIS — R0602 Shortness of breath: Secondary | ICD-10-CM | POA: Diagnosis not present

## 2014-10-10 DIAGNOSIS — I129 Hypertensive chronic kidney disease with stage 1 through stage 4 chronic kidney disease, or unspecified chronic kidney disease: Secondary | ICD-10-CM | POA: Diagnosis not present

## 2014-10-10 DIAGNOSIS — E785 Hyperlipidemia, unspecified: Secondary | ICD-10-CM | POA: Diagnosis not present

## 2014-10-10 DIAGNOSIS — Z720 Tobacco use: Secondary | ICD-10-CM | POA: Diagnosis not present

## 2014-10-10 DIAGNOSIS — R072 Precordial pain: Secondary | ICD-10-CM | POA: Diagnosis not present

## 2014-10-10 DIAGNOSIS — I10 Essential (primary) hypertension: Secondary | ICD-10-CM | POA: Diagnosis not present

## 2014-10-10 DIAGNOSIS — J439 Emphysema, unspecified: Secondary | ICD-10-CM | POA: Diagnosis not present

## 2014-10-10 DIAGNOSIS — K219 Gastro-esophageal reflux disease without esophagitis: Secondary | ICD-10-CM | POA: Diagnosis not present

## 2014-10-10 DIAGNOSIS — E039 Hypothyroidism, unspecified: Secondary | ICD-10-CM | POA: Diagnosis not present

## 2014-10-10 DIAGNOSIS — N189 Chronic kidney disease, unspecified: Secondary | ICD-10-CM | POA: Diagnosis not present

## 2014-10-10 DIAGNOSIS — R10819 Abdominal tenderness, unspecified site: Secondary | ICD-10-CM | POA: Diagnosis not present

## 2014-10-10 DIAGNOSIS — F17210 Nicotine dependence, cigarettes, uncomplicated: Secondary | ICD-10-CM | POA: Diagnosis not present

## 2014-10-11 DIAGNOSIS — R079 Chest pain, unspecified: Secondary | ICD-10-CM | POA: Diagnosis not present

## 2014-10-11 DIAGNOSIS — Z720 Tobacco use: Secondary | ICD-10-CM | POA: Diagnosis not present

## 2014-10-11 DIAGNOSIS — E785 Hyperlipidemia, unspecified: Secondary | ICD-10-CM | POA: Diagnosis not present

## 2014-10-11 DIAGNOSIS — R072 Precordial pain: Secondary | ICD-10-CM | POA: Diagnosis not present

## 2014-10-11 DIAGNOSIS — I10 Essential (primary) hypertension: Secondary | ICD-10-CM | POA: Diagnosis not present

## 2014-10-14 DIAGNOSIS — Z716 Tobacco abuse counseling: Secondary | ICD-10-CM | POA: Diagnosis not present

## 2014-10-14 DIAGNOSIS — Z79899 Other long term (current) drug therapy: Secondary | ICD-10-CM | POA: Diagnosis not present

## 2014-10-14 DIAGNOSIS — F17210 Nicotine dependence, cigarettes, uncomplicated: Secondary | ICD-10-CM | POA: Diagnosis not present

## 2014-10-14 DIAGNOSIS — I129 Hypertensive chronic kidney disease with stage 1 through stage 4 chronic kidney disease, or unspecified chronic kidney disease: Secondary | ICD-10-CM | POA: Diagnosis not present

## 2014-10-14 DIAGNOSIS — N179 Acute kidney failure, unspecified: Secondary | ICD-10-CM | POA: Diagnosis not present

## 2014-10-14 DIAGNOSIS — J441 Chronic obstructive pulmonary disease with (acute) exacerbation: Secondary | ICD-10-CM | POA: Diagnosis not present

## 2014-10-14 DIAGNOSIS — E039 Hypothyroidism, unspecified: Secondary | ICD-10-CM | POA: Diagnosis not present

## 2014-10-14 DIAGNOSIS — R531 Weakness: Secondary | ICD-10-CM | POA: Diagnosis not present

## 2014-10-14 DIAGNOSIS — Z9861 Coronary angioplasty status: Secondary | ICD-10-CM | POA: Diagnosis not present

## 2014-10-14 DIAGNOSIS — S37009A Unspecified injury of unspecified kidney, initial encounter: Secondary | ICD-10-CM | POA: Diagnosis not present

## 2014-10-14 DIAGNOSIS — R0609 Other forms of dyspnea: Secondary | ICD-10-CM | POA: Diagnosis not present

## 2014-10-14 DIAGNOSIS — N189 Chronic kidney disease, unspecified: Secondary | ICD-10-CM | POA: Diagnosis not present

## 2014-10-14 DIAGNOSIS — I10 Essential (primary) hypertension: Secondary | ICD-10-CM | POA: Diagnosis not present

## 2014-10-14 DIAGNOSIS — Z7902 Long term (current) use of antithrombotics/antiplatelets: Secondary | ICD-10-CM | POA: Diagnosis not present

## 2014-10-14 DIAGNOSIS — E860 Dehydration: Secondary | ICD-10-CM | POA: Diagnosis not present

## 2014-10-14 DIAGNOSIS — Z8673 Personal history of transient ischemic attack (TIA), and cerebral infarction without residual deficits: Secondary | ICD-10-CM | POA: Diagnosis not present

## 2014-10-14 DIAGNOSIS — R1013 Epigastric pain: Secondary | ICD-10-CM | POA: Diagnosis not present

## 2014-10-14 DIAGNOSIS — E780 Pure hypercholesterolemia: Secondary | ICD-10-CM | POA: Diagnosis not present

## 2014-10-14 DIAGNOSIS — R069 Unspecified abnormalities of breathing: Secondary | ICD-10-CM | POA: Diagnosis not present

## 2014-10-14 DIAGNOSIS — J449 Chronic obstructive pulmonary disease, unspecified: Secondary | ICD-10-CM | POA: Diagnosis not present

## 2014-10-14 DIAGNOSIS — I2510 Atherosclerotic heart disease of native coronary artery without angina pectoris: Secondary | ICD-10-CM | POA: Diagnosis not present

## 2014-10-14 DIAGNOSIS — R0600 Dyspnea, unspecified: Secondary | ICD-10-CM | POA: Diagnosis not present

## 2014-10-19 DIAGNOSIS — J441 Chronic obstructive pulmonary disease with (acute) exacerbation: Secondary | ICD-10-CM | POA: Diagnosis not present

## 2014-10-19 DIAGNOSIS — R51 Headache: Secondary | ICD-10-CM | POA: Diagnosis not present

## 2014-10-19 DIAGNOSIS — B370 Candidal stomatitis: Secondary | ICD-10-CM | POA: Diagnosis not present

## 2014-10-19 DIAGNOSIS — F329 Major depressive disorder, single episode, unspecified: Secondary | ICD-10-CM | POA: Diagnosis not present

## 2014-10-20 DIAGNOSIS — R1084 Generalized abdominal pain: Secondary | ICD-10-CM | POA: Diagnosis not present

## 2014-10-20 DIAGNOSIS — I10 Essential (primary) hypertension: Secondary | ICD-10-CM | POA: Diagnosis not present

## 2014-10-20 DIAGNOSIS — I2510 Atherosclerotic heart disease of native coronary artery without angina pectoris: Secondary | ICD-10-CM | POA: Diagnosis not present

## 2014-10-20 DIAGNOSIS — E780 Pure hypercholesterolemia: Secondary | ICD-10-CM | POA: Diagnosis not present

## 2014-10-20 DIAGNOSIS — E039 Hypothyroidism, unspecified: Secondary | ICD-10-CM | POA: Diagnosis not present

## 2014-10-20 DIAGNOSIS — R531 Weakness: Secondary | ICD-10-CM | POA: Diagnosis not present

## 2014-10-20 DIAGNOSIS — E876 Hypokalemia: Secondary | ICD-10-CM | POA: Diagnosis not present

## 2014-10-20 DIAGNOSIS — G35 Multiple sclerosis: Secondary | ICD-10-CM | POA: Diagnosis not present

## 2014-10-20 DIAGNOSIS — R109 Unspecified abdominal pain: Secondary | ICD-10-CM | POA: Diagnosis not present

## 2014-10-21 DIAGNOSIS — F329 Major depressive disorder, single episode, unspecified: Secondary | ICD-10-CM | POA: Diagnosis not present

## 2014-10-21 DIAGNOSIS — J441 Chronic obstructive pulmonary disease with (acute) exacerbation: Secondary | ICD-10-CM | POA: Diagnosis not present

## 2014-10-25 DIAGNOSIS — F329 Major depressive disorder, single episode, unspecified: Secondary | ICD-10-CM | POA: Diagnosis not present

## 2014-10-25 DIAGNOSIS — J441 Chronic obstructive pulmonary disease with (acute) exacerbation: Secondary | ICD-10-CM | POA: Diagnosis not present

## 2014-10-26 DIAGNOSIS — F329 Major depressive disorder, single episode, unspecified: Secondary | ICD-10-CM | POA: Diagnosis not present

## 2014-10-26 DIAGNOSIS — J441 Chronic obstructive pulmonary disease with (acute) exacerbation: Secondary | ICD-10-CM | POA: Diagnosis not present

## 2014-10-27 DIAGNOSIS — F329 Major depressive disorder, single episode, unspecified: Secondary | ICD-10-CM | POA: Diagnosis not present

## 2014-10-27 DIAGNOSIS — J441 Chronic obstructive pulmonary disease with (acute) exacerbation: Secondary | ICD-10-CM | POA: Diagnosis not present

## 2014-10-31 DIAGNOSIS — F329 Major depressive disorder, single episode, unspecified: Secondary | ICD-10-CM | POA: Diagnosis not present

## 2014-10-31 DIAGNOSIS — J441 Chronic obstructive pulmonary disease with (acute) exacerbation: Secondary | ICD-10-CM | POA: Diagnosis not present

## 2014-11-01 DIAGNOSIS — F329 Major depressive disorder, single episode, unspecified: Secondary | ICD-10-CM | POA: Diagnosis not present

## 2014-11-01 DIAGNOSIS — J441 Chronic obstructive pulmonary disease with (acute) exacerbation: Secondary | ICD-10-CM | POA: Diagnosis not present

## 2014-11-02 DIAGNOSIS — F329 Major depressive disorder, single episode, unspecified: Secondary | ICD-10-CM | POA: Diagnosis not present

## 2014-11-02 DIAGNOSIS — J441 Chronic obstructive pulmonary disease with (acute) exacerbation: Secondary | ICD-10-CM | POA: Diagnosis not present

## 2014-11-04 ENCOUNTER — Ambulatory Visit: Payer: Self-pay

## 2014-11-14 ENCOUNTER — Ambulatory Visit: Payer: Self-pay

## 2014-12-20 ENCOUNTER — Institutional Professional Consult (permissible substitution): Payer: Medicaid Other | Admitting: Critical Care Medicine

## 2014-12-20 ENCOUNTER — Encounter: Payer: Self-pay | Admitting: Internal Medicine

## 2015-03-08 DIAGNOSIS — F17200 Nicotine dependence, unspecified, uncomplicated: Secondary | ICD-10-CM | POA: Diagnosis not present

## 2015-03-08 DIAGNOSIS — J449 Chronic obstructive pulmonary disease, unspecified: Secondary | ICD-10-CM | POA: Diagnosis not present

## 2015-03-08 DIAGNOSIS — Z720 Tobacco use: Secondary | ICD-10-CM | POA: Diagnosis not present

## 2015-03-08 DIAGNOSIS — I10 Essential (primary) hypertension: Secondary | ICD-10-CM | POA: Diagnosis not present

## 2015-03-08 DIAGNOSIS — E038 Other specified hypothyroidism: Secondary | ICD-10-CM | POA: Diagnosis not present

## 2015-04-04 DIAGNOSIS — R319 Hematuria, unspecified: Secondary | ICD-10-CM | POA: Diagnosis not present

## 2015-04-04 DIAGNOSIS — Z8551 Personal history of malignant neoplasm of bladder: Secondary | ICD-10-CM | POA: Diagnosis not present

## 2015-10-16 ENCOUNTER — Telehealth: Payer: Self-pay | Admitting: Internal Medicine

## 2015-10-16 NOTE — Telephone Encounter (Signed)
Notified patient of md response and got scheduled

## 2015-10-16 NOTE — Telephone Encounter (Signed)
Patient would like to know if Dr. Jenny Reichmann would take her back on as a patient.  [place] the area.  Patient has medicare and medicaid.

## 2015-10-16 NOTE — Telephone Encounter (Signed)
Ok , but please let pt know that I no longer practice chronic pain management and do not rx monthly narcotic type medications such as vicodin, thanks

## 2015-10-23 ENCOUNTER — Encounter (HOSPITAL_COMMUNITY): Payer: Self-pay | Admitting: Emergency Medicine

## 2015-10-23 ENCOUNTER — Emergency Department (HOSPITAL_COMMUNITY): Payer: Medicare Other

## 2015-10-23 ENCOUNTER — Emergency Department (HOSPITAL_COMMUNITY)
Admission: EM | Admit: 2015-10-23 | Discharge: 2015-10-23 | Disposition: A | Discharge status: Home / Self Care | Payer: Medicare Other | Attending: Emergency Medicine | Admitting: Emergency Medicine

## 2015-10-23 DIAGNOSIS — Z7952 Long term (current) use of systemic steroids: Secondary | ICD-10-CM | POA: Insufficient documentation

## 2015-10-23 DIAGNOSIS — R05 Cough: Secondary | ICD-10-CM | POA: Diagnosis not present

## 2015-10-23 DIAGNOSIS — J439 Emphysema, unspecified: Secondary | ICD-10-CM | POA: Diagnosis not present

## 2015-10-23 DIAGNOSIS — Z7902 Long term (current) use of antithrombotics/antiplatelets: Secondary | ICD-10-CM | POA: Diagnosis not present

## 2015-10-23 DIAGNOSIS — Z8619 Personal history of other infectious and parasitic diseases: Secondary | ICD-10-CM | POA: Insufficient documentation

## 2015-10-23 DIAGNOSIS — I10 Essential (primary) hypertension: Secondary | ICD-10-CM | POA: Insufficient documentation

## 2015-10-23 DIAGNOSIS — Z8701 Personal history of pneumonia (recurrent): Secondary | ICD-10-CM | POA: Diagnosis not present

## 2015-10-23 DIAGNOSIS — K219 Gastro-esophageal reflux disease without esophagitis: Secondary | ICD-10-CM | POA: Insufficient documentation

## 2015-10-23 DIAGNOSIS — L603 Nail dystrophy: Secondary | ICD-10-CM

## 2015-10-23 DIAGNOSIS — M1990 Unspecified osteoarthritis, unspecified site: Secondary | ICD-10-CM | POA: Diagnosis not present

## 2015-10-23 DIAGNOSIS — Z8673 Personal history of transient ischemic attack (TIA), and cerebral infarction without residual deficits: Secondary | ICD-10-CM | POA: Insufficient documentation

## 2015-10-23 DIAGNOSIS — Z86010 Personal history of colonic polyps: Secondary | ICD-10-CM | POA: Insufficient documentation

## 2015-10-23 DIAGNOSIS — R053 Chronic cough: Secondary | ICD-10-CM

## 2015-10-23 DIAGNOSIS — E0590 Thyrotoxicosis, unspecified without thyrotoxic crisis or storm: Secondary | ICD-10-CM | POA: Diagnosis not present

## 2015-10-23 DIAGNOSIS — Z79899 Other long term (current) drug therapy: Secondary | ICD-10-CM | POA: Insufficient documentation

## 2015-10-23 DIAGNOSIS — Z8544 Personal history of malignant neoplasm of other female genital organs: Secondary | ICD-10-CM | POA: Diagnosis not present

## 2015-10-23 DIAGNOSIS — F17200 Nicotine dependence, unspecified, uncomplicated: Secondary | ICD-10-CM | POA: Insufficient documentation

## 2015-10-23 DIAGNOSIS — F329 Major depressive disorder, single episode, unspecified: Secondary | ICD-10-CM | POA: Insufficient documentation

## 2015-10-23 MED ORDER — GUAIFENESIN-CODEINE 100-10 MG/5ML PO SOLN: 5.0000 mL | Freq: Four times a day (QID) | ORAL | Status: DC | PRN

## 2015-10-23 MED ORDER — ALBUTEROL SULFATE HFA 108 (90 BASE) MCG/ACT IN AERS
2.0000 | INHALATION_SPRAY | Freq: Once | RESPIRATORY_TRACT | Status: AC
  Administered 2015-10-23: 2 via RESPIRATORY_TRACT
  Filled 2015-10-23: qty 6.7

## 2015-10-23 NOTE — Discharge Instructions (Signed)

## 2015-10-23 NOTE — ED Notes (Signed)
PT DISCHARGED. INSTRUCTIONS AND PRESCRIPTION GIVEN. AAOX3. PT IN NO APPARENT DISTRESS OR PAIN. THE OPPORTUNITY TO ASK QUESTIONS WAS PROVIDED. 

## 2015-10-23 NOTE — ED Notes (Signed)
Pt reports Dx on Friday with Pneumonia, despite ATB still feel no relief pert pt. Pt reports chest pain and coughing, also upper  back pain x 2 weeks. Alert and oriented x 4.

## 2015-10-23 NOTE — ED Provider Notes (Addendum)
CSN: [id number]     Arrival date & time 10/23/15  1305 History   First MD Initiated Contact with Patient 10/23/15 2042     Chief Complaint  Patient presents with  . Shortness of Breath     (Consider location/radiation/quality/duration/timing/severity/associated sxs/prior Treatment) HPI   70 year old female with cough and shortness of breath.Symptom onset approximately one half weeks ago.she was seen at urgent care on Friday and diagnosed with pneumonia. She was started on Augmentin. She's been taking this without much change in her symptoms. She denies any fevers or chills. No unusual leg pain or swelling. The cough is occasionally productive for whitish sputum.Chest sore and sometimes gets some pain in her back with coughing. Really no change with exertion.  Past Medical History  Diagnosis Date  . Genital warts   . Heart disease   . History of colon polyps   . Arthritis   . COPD (chronic obstructive pulmonary disease) ([place])   . Depression   . Emphysema of lung ([place])   . Hypertension   . Hyperlipidemia   . Multinodular goiter (nontoxic)   . Ischemia, bowel ([place])   . Carotid artery stenosis     bilateral  . Hyperthyroidism     overactive thyroid   . Shortness of breath     with exertion   . GERD (gastroesophageal reflux disease)   . Stroke [facility]) 2010    with left leg weakness > L:UE  . Vulva cancer ([place]) 1975   Past Surgical History  Procedure Laterality Date  . Cholecystectomy    . Tubal ligation    . Appendectomy    . Carpal tunnel release    . Nose surgery    . Abdominal hysterectomy  1964  . Shoulder open rotator cuff repair  04/09/2012    Procedure: ROTATOR CUFF REPAIR SHOULDER OPEN;  Surgeon: Johnn Hai, MD;  Location: [facility];  Service: Orthopedics;  Laterality: Right;  Right Open Mini Rotator Cuff Repair, Distal Clavicle Resection     Family History  Problem Relation Age of Onset  . Alcohol abuse Other   . Arthritis Other   . Heart disease Other   .  Stroke Other   . Kidney disease Other   . Diabetes Other   . Hypertension Other   . Arthritis Other   . Heart disease Other   . Mental illness Other   . Diabetes Other   . Hypertension Other   . Alcohol abuse Other   . Hyperlipidemia Other   . Heart disease Other   . Hypertension Other   . Mental illness Other   . Diabetes Other   . Colon cancer Neg Hx   . Colon polyps Sister   . CVA Mother   . Alcoholism Father   . Cirrhosis Father    Social History  Substance Use Topics  . Smoking status: Current Every Day Smoker -- 1.50 packs/day for 50 years  . Smokeless tobacco: Never Used  . Alcohol Use: Yes     Comment: rare   OB History    No data available     Review of Systems  All systems reviewed and negative, other than as noted in HPI.   Allergies  Review of patient's allergies indicates no known allergies.  Home Medications   Prior to Admission medications   Medication Sig Start Date End Date Taking? Authorizing Provider  albuterol (PROVENTIL HFA;VENTOLIN HFA) 108 (90 BASE) MCG/ACT inhaler Inhale 2 puffs into the lungs every 6 (six) hours  as needed for wheezing or shortness of breath. 03/01/14   Nishant Dhungel, MD  albuterol (PROVENTIL) (2.5 MG/3ML) 0.083% nebulizer solution Take 3 mLs (2.5 mg total) by nebulization every 6 (six) hours as needed for wheezing or shortness of breath. 03/01/14   Nishant Dhungel, MD  ALPRAZolam (XANAX) 0.25 MG tablet Take 0.25 mg by mouth 2 (two) times daily as needed for anxiety.     Historical Provider, MD  amLODipine (NORVASC) 10 MG tablet Take 10 mg by mouth at bedtime. 01/17/12   Biagio Borg, MD  clopidogrel (PLAVIX) 75 MG tablet Take 1 tablet (75 mg total) by mouth daily. 04/07/12   Biagio Borg, MD  FLUoxetine (PROZAC) 40 MG capsule Take 40 mg by mouth at bedtime. 01/17/12   Biagio Borg, MD  HYDROcodone-acetaminophen ([place]um) 7.5-325 MG per tablet Take 1 tablet by mouth every 6 (six) hours as needed for moderate pain.    Historical  Provider, MD  levothyroxine (SYNTHROID, LEVOTHROID) 100 MCG tablet Take 1 tablet (100 mcg total) by mouth daily before breakfast. 03/01/14   Nishant Dhungel, MD  lisinopril (PRINIVIL,ZESTRIL) 20 MG tablet Take 20 mg by mouth at bedtime. 01/17/12   Biagio Borg, MD  mometasone-formoterol [facility]) 200-5 MCG/ACT AERO Inhale 2 puffs into the lungs 2 (two) times daily. 03/01/14   Nishant Dhungel, MD  nicotine (NICODERM CQ - DOSED IN MG/24 HOURS) 21 mg/24hr patch Place 1 patch (21 mg total) onto the skin daily. 03/01/14   Nishant Dhungel, MD  pantoprazole (PROTONIX) 40 MG tablet Take 40 mg by mouth daily.    Historical Provider, MD  pramipexole (MIRAPEX) 0.25 MG tablet Take 0.25 mg by mouth every evening.    Historical Provider, MD  predniSONE (DELTASONE) 20 MG tablet Take 1 tablet (20 mg total) by mouth daily with breakfast. 03/01/14   Nishant Dhungel, MD  tiotropium (SPIRIVA) 18 MCG inhalation capsule Place 1 capsule (18 mcg total) into inhaler and inhale daily. 03/01/14   Nishant Dhungel, MD  traZODone (DESYREL) 50 MG tablet Take 50 mg by mouth at bedtime.    Historical Provider, MD  triamcinolone cream (KENALOG) 0.5 % Apply topically 3 (three) times daily. Over right foot 03/01/14   Nishant Dhungel, MD   BP 133/65 mmHg  Pulse 82  Temp(Src) 97.5 F (36.4 C) (Oral)  Resp 18  SpO2 97% Physical Exam  Constitutional: She appears well-developed and well-nourished. No distress.  Sitting on a chair beside the stretcher. No apparent distress.  HENT:  Head: Normocephalic and atraumatic.  Eyes: Conjunctivae are normal. Right eye exhibits no discharge. Left eye exhibits no discharge.  Neck: Neck supple.  Cardiovascular: Normal rate, regular rhythm and normal heart sounds.  Exam reveals no gallop and no friction rub.   No murmur heard. Pulmonary/Chest: Effort normal. No respiratory distress. She has wheezes.  mild wheezing on exam. no increased work of breathing.Can speak in complete sentences.  Abdominal:  Soft. She exhibits no distension. There is no tenderness.  Musculoskeletal: She exhibits no edema or tenderness.  Lower extremities symmetric as compared to each other. No calf tenderness. Negative Homan's. No palpable cords.   Neurological: She is alert.  Skin: Skin is warm and dry.  Nail of the right middle finger is cracked and starting to lift slightly.No bleeding. No concerning surrounding skin changes.  Psychiatric: She has a normal mood and affect. Her behavior is normal. Thought content normal.  Nursing note and vitals reviewed.   ED Course  Procedures (including critical care time)  Labs Review Labs Reviewed - No data to display  Imaging Review Dg Chest 2 View  10/23/2015  CLINICAL DATA:  Cough and shortness of breath for 3 days. Initial encounter. EXAM: CHEST  2 VIEW COMPARISON:  PA and lateral chest 10/14/2014 and 01/16/2014. CT chest 01/16/2014. FINDINGS: The lungs are markedly emphysematous but clear. Heart size is normal. No pneumothorax or pleural effusion. No focal bony abnormality. IMPRESSION: Emphysema without acute disease. Electronically Signed   By: Inge Rise M.D.   On: 10/23/2015 13:48   I have personally reviewed and evaluated these images and lab results as part of my medical decision-making.   EKG Interpretation   Date/Time:  Monday October 23 2015 13:38:53 EDT Ventricular Rate:  74 PR Interval:  112 QRS Duration: 90 QT Interval:  389 QTC Calculation: 432 R Axis:   43 Text Interpretation:  Sinus rhythm Borderline short PR interval Abnormal  R-wave progression, early transition Confirmed by Jovanne Riggenbach  MD, Aviana Shevlin  ([id number]) on 10/23/2015 9:12:43 PM      MDM   Final diagnoses:  Persistent cough  Cracked nails    69yF with persistent cough. Recently diagnosed with pneumonia. Reports CXR at [facility] and started on augmentin. CXR today w/o infiltrate. Recent one not available for review. Mild wheezing on exam. Discussed that cough from pneumonia can linger  despite appropriate treatment. This may potentially be bronchitis which is typically viral but can also cause a persistent cough. Doubt GERD, postnasal drip. Doubt PE. Already on steroids. Provided with albuterol inhaler. PRN cough medication. Additionally, cracked finger nail which she is concerned may catch on something an ripped off. Dermabond applied.      Virgel Manifold, MD 10/31/15 [phone]

## 2015-10-31 ENCOUNTER — Ambulatory Visit: Payer: Medicaid Other | Admitting: Internal Medicine

## 2015-11-14 ENCOUNTER — Ambulatory Visit: Payer: Medicare Other | Admitting: Family Medicine

## 2018-01-20 IMAGING — CR DG CHEST 2V
2 series · 2 of 2 positions shown · non-contrast
Comparison: PA and lateral chest 10/14/2014 and 01/16/2014. CT
chest 01/16/2014.

CLINICAL DATA: Cough and shortness of breath for 3 days. Initial
encounter.

EXAM:
CHEST  2 VIEW

[w chest pa]
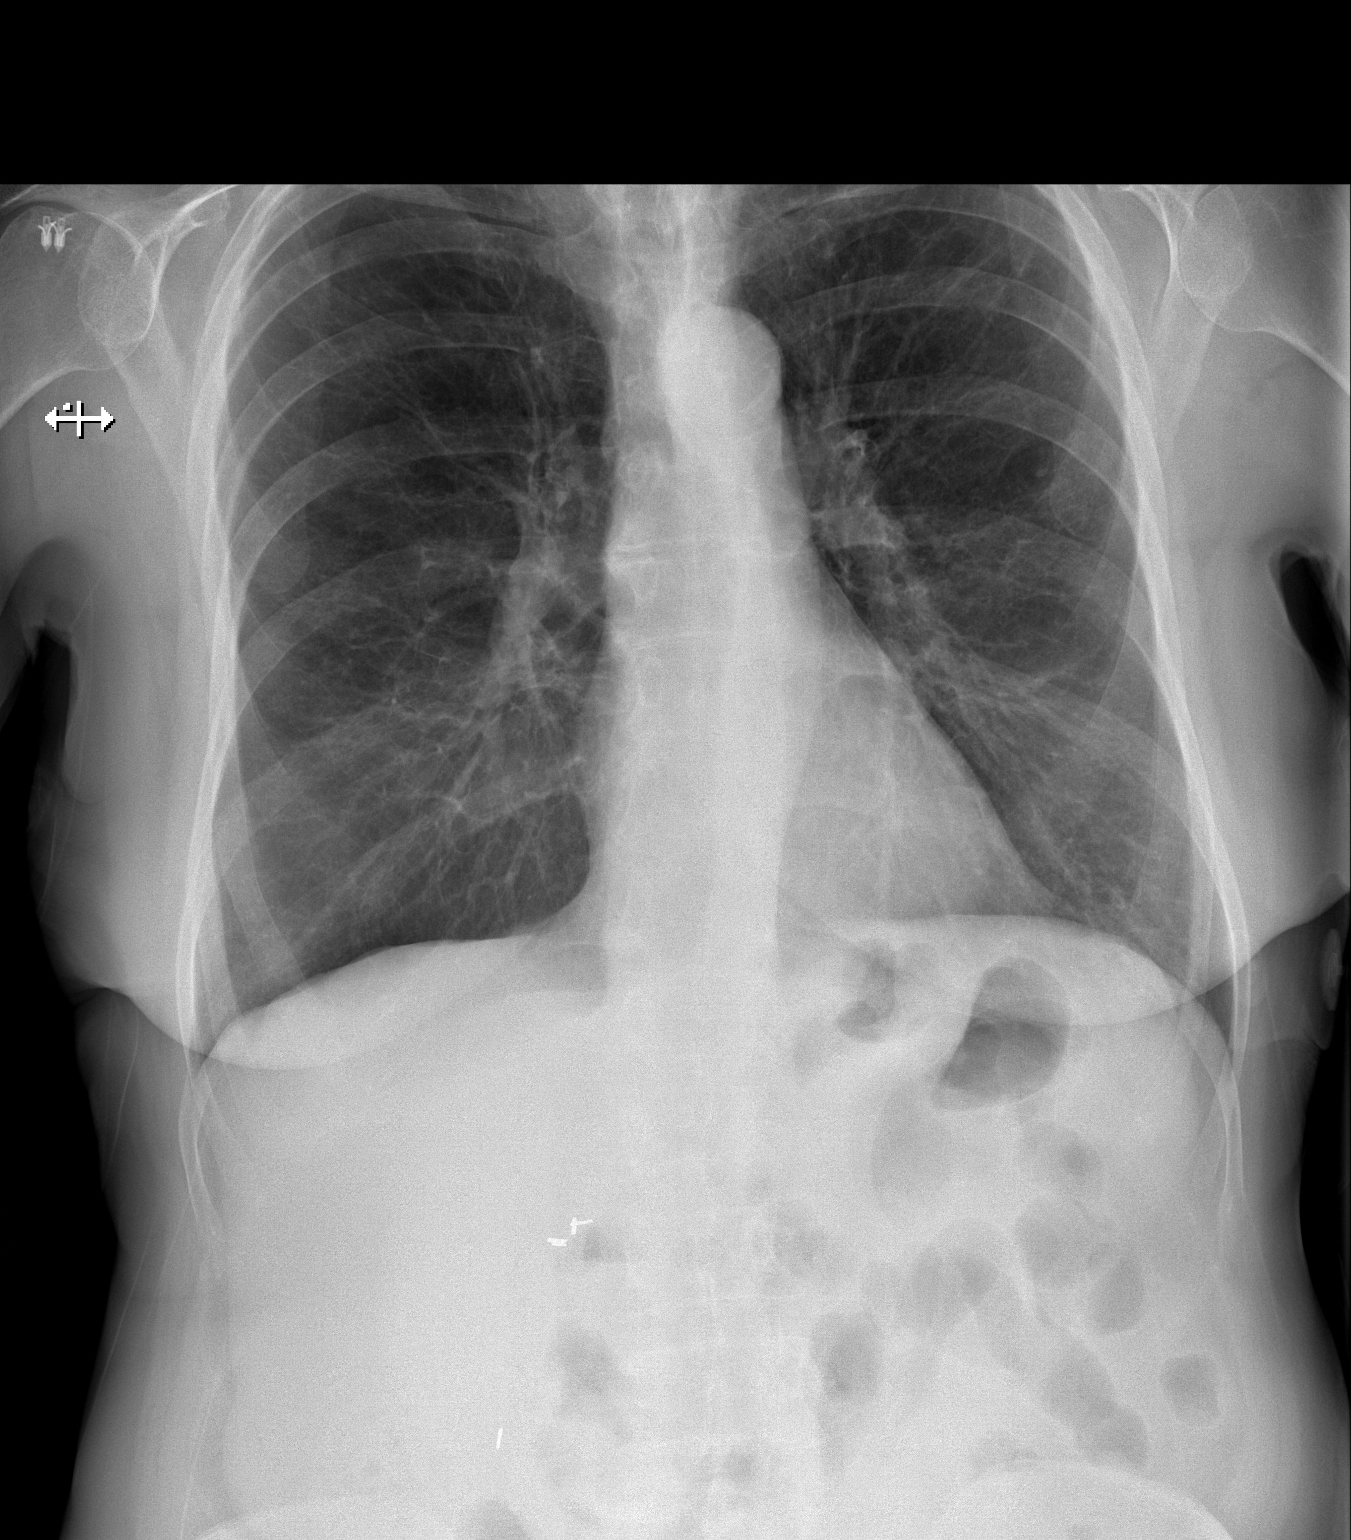

[w chest lat]
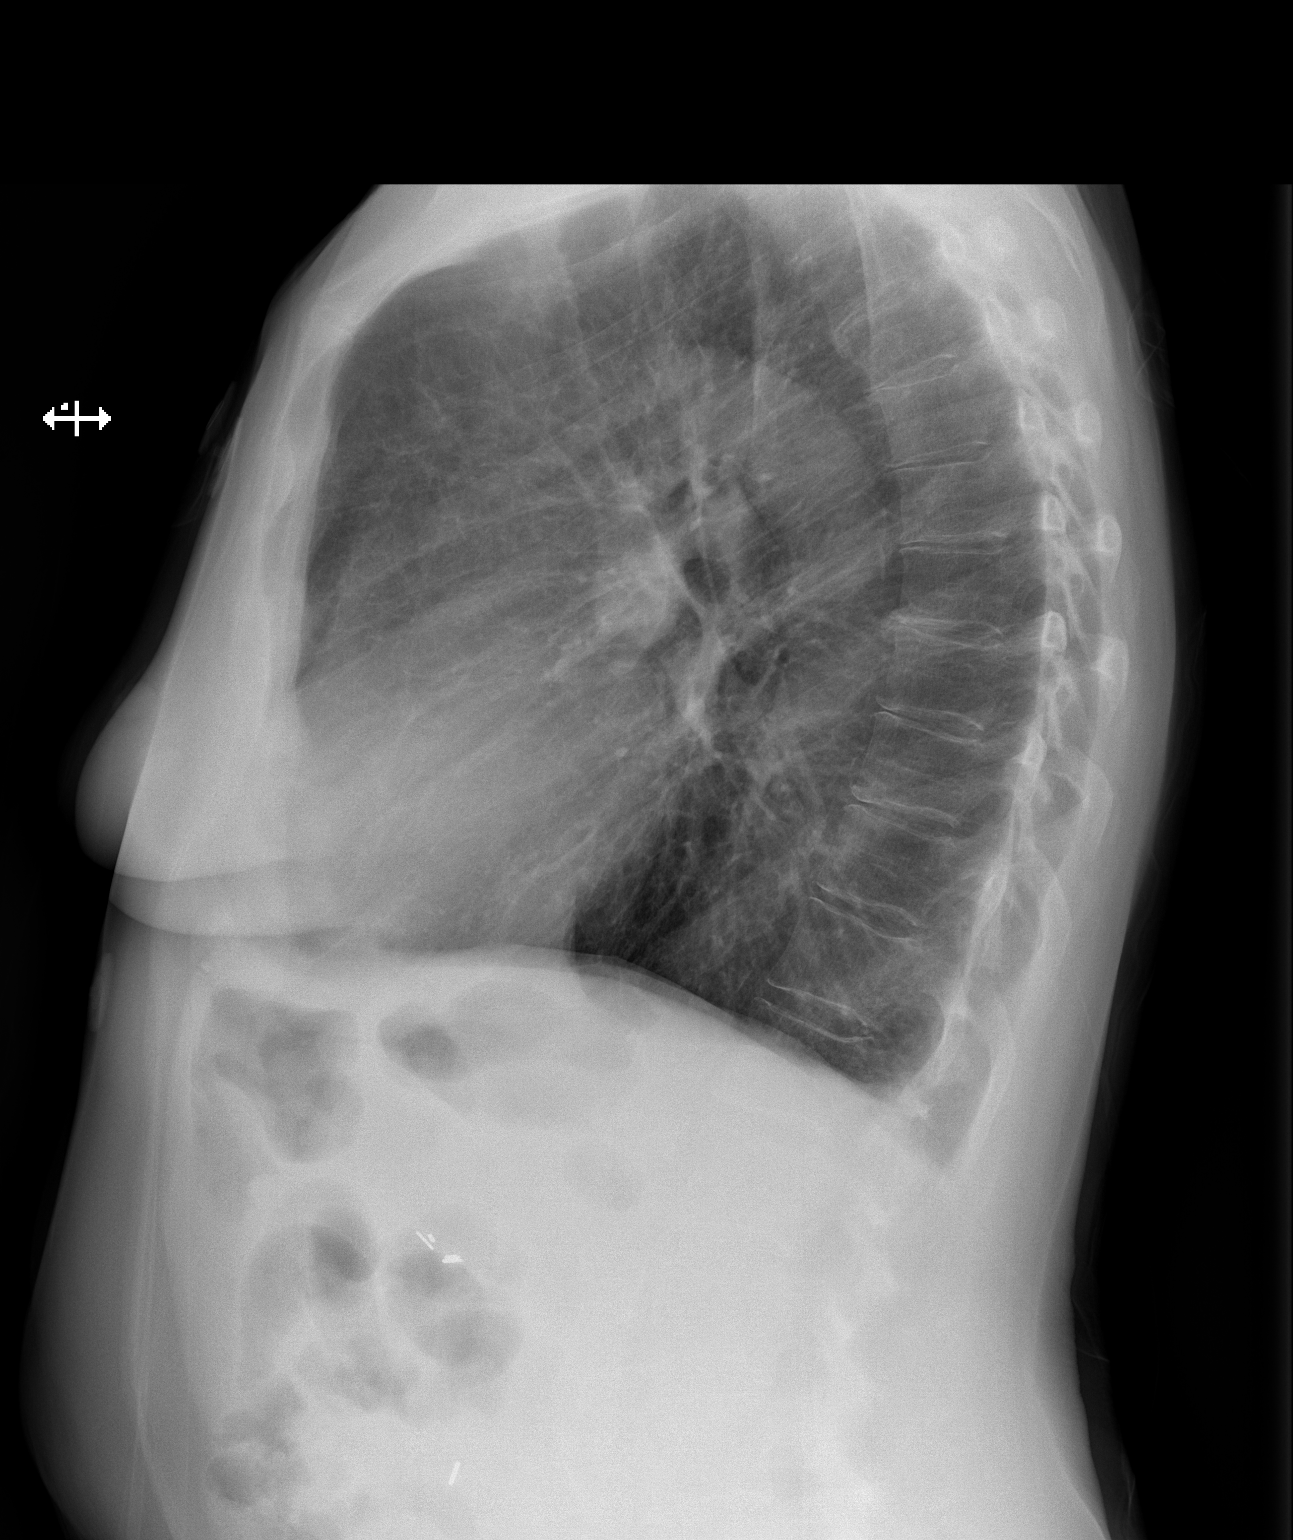

[2 of 2 positions shown; findings below may reference images not displayed]

FINDINGS: The lungs are markedly emphysematous but clear. Heart size is
normal. No pneumothorax or pleural effusion. No focal bony
abnormality.
IMPRESSION: Emphysema without acute disease.

## 2022-11-04 DEATH — deceased
